# Patient Record
Sex: Male | Born: 1935 | Hispanic: No | Marital: Married | Smoking: Never smoker
Health system: Southern US, Community
[De-identification: ages and names within clinical notes are randomized; demographics above are authoritative.]

## PROBLEM LIST (undated history)

## (undated) DIAGNOSIS — I10 Essential (primary) hypertension: Secondary | ICD-10-CM

## (undated) DIAGNOSIS — I2510 Atherosclerotic heart disease of native coronary artery without angina pectoris: Secondary | ICD-10-CM

## (undated) DIAGNOSIS — E785 Hyperlipidemia, unspecified: Secondary | ICD-10-CM

## (undated) DIAGNOSIS — D649 Anemia, unspecified: Secondary | ICD-10-CM

## (undated) HISTORY — DX: Anemia, unspecified: D64.9

## (undated) HISTORY — DX: Atherosclerotic heart disease of native coronary artery without angina pectoris: I25.10

## (undated) HISTORY — PX: CORONARY ARTERY BYPASS GRAFT: SHX141

## (undated) HISTORY — DX: Essential (primary) hypertension: I10

## (undated) HISTORY — DX: Hyperlipidemia, unspecified: E78.5

## (undated) HISTORY — PX: CARDIAC CATHETERIZATION: SHX172

---

## 2003-06-20 ENCOUNTER — Encounter: Payer: Self-pay | Admitting: Cardiovascular Disease

## 2003-06-20 HISTORY — PX: US ECHOCARDIOGRAPHY: HXRAD669

## 2004-07-20 ENCOUNTER — Ambulatory Visit (HOSPITAL_COMMUNITY): Admission: RE | Admit: 2004-07-20 | Discharge: 2004-07-20 | Payer: Self-pay | Admitting: *Deleted

## 2004-07-20 ENCOUNTER — Encounter (INDEPENDENT_AMBULATORY_CARE_PROVIDER_SITE_OTHER): Payer: Self-pay | Admitting: *Deleted

## 2009-12-04 ENCOUNTER — Ambulatory Visit: Payer: Self-pay | Admitting: Cardiovascular Disease

## 2010-06-08 ENCOUNTER — Encounter: Payer: Self-pay | Admitting: Cardiovascular Disease

## 2010-06-08 ENCOUNTER — Ambulatory Visit (INDEPENDENT_AMBULATORY_CARE_PROVIDER_SITE_OTHER): Payer: Medicare Other | Admitting: Cardiovascular Disease

## 2010-06-08 DIAGNOSIS — Z951 Presence of aortocoronary bypass graft: Secondary | ICD-10-CM

## 2010-06-08 DIAGNOSIS — I200 Unstable angina: Secondary | ICD-10-CM

## 2010-06-09 ENCOUNTER — Encounter: Payer: Self-pay | Admitting: Cardiovascular Disease

## 2010-06-24 ENCOUNTER — Other Ambulatory Visit (HOSPITAL_COMMUNITY): Payer: Self-pay | Admitting: Endocrinology

## 2010-06-24 DIAGNOSIS — E0590 Thyrotoxicosis, unspecified without thyrotoxic crisis or storm: Secondary | ICD-10-CM

## 2010-06-28 ENCOUNTER — Telehealth (HOSPITAL_COMMUNITY): Payer: Self-pay | Admitting: Radiology

## 2010-06-29 ENCOUNTER — Encounter (HOSPITAL_COMMUNITY): Payer: Medicare Other

## 2010-07-01 ENCOUNTER — Other Ambulatory Visit: Payer: Self-pay | Admitting: *Deleted

## 2010-07-01 DIAGNOSIS — I2510 Atherosclerotic heart disease of native coronary artery without angina pectoris: Secondary | ICD-10-CM

## 2010-07-01 MED ORDER — ATENOLOL 50 MG PO TABS: 50.0000 mg | ORAL_TABLET | Freq: Every day | ORAL | Status: DC

## 2010-07-01 NOTE — Telephone Encounter (Signed)
Refill per incoming fax from pharm.

## 2010-07-08 NOTE — Progress Notes (Signed)
Summary: Resched Myoview to 01/06/11  Phone Note Outgoing Call   Call placed by: Leonia Corona, RT-N,  June 28, 2010 12:00 PM Call placed to: Dr. Harvie Bridge Nurse Summary of Call: Spoke with Amy at Dr. Harvie Bridge office to inform her that Larry Schroeder resched his Myoview from 06/29/10 to 01/06/11. She stated she would make Dr. Elease Hashimoto aware. Initial call taken by: Leonia Corona, RT-N,  June 28, 2010 12:01 PM

## 2010-07-13 ENCOUNTER — Ambulatory Visit (HOSPITAL_COMMUNITY): Payer: Medicare Other

## 2010-07-13 ENCOUNTER — Encounter (HOSPITAL_COMMUNITY)
Admission: RE | Admit: 2010-07-13 | Discharge: 2010-07-13 | Disposition: A | Discharge status: Home / Self Care | Payer: Medicare Other | Source: Ambulatory Visit | Attending: Endocrinology | Admitting: Endocrinology

## 2010-07-13 DIAGNOSIS — E0590 Thyrotoxicosis, unspecified without thyrotoxic crisis or storm: Secondary | ICD-10-CM | POA: Insufficient documentation

## 2010-07-13 DIAGNOSIS — R946 Abnormal results of thyroid function studies: Secondary | ICD-10-CM | POA: Insufficient documentation

## 2010-07-14 ENCOUNTER — Encounter (HOSPITAL_COMMUNITY)
Admission: RE | Admit: 2010-07-14 | Discharge: 2010-07-14 | Disposition: A | Discharge status: Home / Self Care | Payer: Medicare Other | Source: Ambulatory Visit | Attending: Endocrinology | Admitting: Endocrinology

## 2010-07-14 ENCOUNTER — Other Ambulatory Visit (HOSPITAL_COMMUNITY): Payer: Medicare Other

## 2010-07-14 MED ORDER — SODIUM IODIDE I 131 CAPSULE
12.0000 | Freq: Once | INTRAVENOUS | Status: AC | PRN
  Administered 2010-07-13: 12 via ORAL

## 2010-07-14 MED ORDER — SODIUM PERTECHNETATE TC 99M INJECTION
10.0000 | Freq: Once | INTRAVENOUS | Status: AC | PRN
  Administered 2010-07-14: 10.7 via INTRAVENOUS

## 2010-07-15 ENCOUNTER — Other Ambulatory Visit (HOSPITAL_COMMUNITY): Payer: Self-pay | Admitting: Endocrinology

## 2010-07-15 DIAGNOSIS — E0590 Thyrotoxicosis, unspecified without thyrotoxic crisis or storm: Secondary | ICD-10-CM

## 2010-07-20 ENCOUNTER — Ambulatory Visit (HOSPITAL_COMMUNITY): Payer: Medicare Other

## 2010-07-21 ENCOUNTER — Encounter (HOSPITAL_COMMUNITY): Payer: Medicare Other

## 2010-08-05 ENCOUNTER — Ambulatory Visit (HOSPITAL_COMMUNITY): Payer: Medicare Other

## 2010-08-06 ENCOUNTER — Inpatient Hospital Stay (HOSPITAL_COMMUNITY): Admission: RE | Admit: 2010-08-06 | Payer: Medicare Other | Source: Ambulatory Visit

## 2010-08-12 ENCOUNTER — Encounter (HOSPITAL_COMMUNITY)
Admission: RE | Admit: 2010-08-12 | Discharge: 2010-08-12 | Disposition: A | Discharge status: Home / Self Care | Payer: Medicare Other | Source: Ambulatory Visit | Attending: Endocrinology | Admitting: Endocrinology

## 2010-08-12 DIAGNOSIS — E0590 Thyrotoxicosis, unspecified without thyrotoxic crisis or storm: Secondary | ICD-10-CM | POA: Insufficient documentation

## 2010-08-13 ENCOUNTER — Ambulatory Visit (HOSPITAL_COMMUNITY): Payer: Medicare Other

## 2010-08-17 ENCOUNTER — Other Ambulatory Visit (HOSPITAL_COMMUNITY): Payer: Self-pay | Admitting: Endocrinology

## 2010-08-17 DIAGNOSIS — E0590 Thyrotoxicosis, unspecified without thyrotoxic crisis or storm: Secondary | ICD-10-CM

## 2010-08-25 ENCOUNTER — Ambulatory Visit (HOSPITAL_COMMUNITY): Payer: Medicare Other

## 2010-08-25 ENCOUNTER — Encounter (HOSPITAL_COMMUNITY)
Admission: RE | Admit: 2010-08-25 | Discharge: 2010-08-25 | Disposition: A | Discharge status: Home / Self Care | Payer: Medicare Other | Source: Ambulatory Visit | Attending: Endocrinology | Admitting: Endocrinology

## 2010-08-25 DIAGNOSIS — E0590 Thyrotoxicosis, unspecified without thyrotoxic crisis or storm: Secondary | ICD-10-CM | POA: Insufficient documentation

## 2010-08-26 ENCOUNTER — Encounter (HOSPITAL_COMMUNITY): Payer: Medicare Other

## 2010-08-26 ENCOUNTER — Ambulatory Visit (HOSPITAL_COMMUNITY)
Admission: RE | Admit: 2010-08-26 | Discharge: 2010-08-26 | Disposition: A | Discharge status: Home / Self Care | Payer: Medicare Other | Source: Ambulatory Visit | Attending: Endocrinology | Admitting: Endocrinology

## 2010-08-26 DIAGNOSIS — E0590 Thyrotoxicosis, unspecified without thyrotoxic crisis or storm: Secondary | ICD-10-CM | POA: Insufficient documentation

## 2010-08-26 MED ORDER — SODIUM IODIDE I 131 CAPSULE
7.7000 | Freq: Once | INTRAVENOUS | Status: AC | PRN
  Administered 2010-08-25: 7.7 via ORAL

## 2010-08-27 NOTE — Op Note (Signed)
NAMEVIBHAV, WADDILL NO.:  [id number]   MEDICAL RECORD NO.:  [id number]          PATIENT TYPE:  AMB   LOCATION:  ENDO                         FACILITY:  [facility]   PHYSICIAN:  Georgiana Spinner, M.D.    DATE OF BIRTH:  1933/09/10   DATE OF PROCEDURE:  07/20/2004  DATE OF DISCHARGE:                                 OPERATIVE REPORT   PROCEDURE:  Colonoscopy with biopsy.   INDICATIONS:  Colon polyps.   ANESTHESIA:  Demerol 40, Versed 4 milligrams.   PROCEDURE:  With the patient mildly sedated in the left lateral decubitus  position, rectal examination was performed which was unremarkable.  Subsequently the Olympus videoscopic colonoscope was inserted in the rectum  and passed under direct vision to the cecum, identified by ileocecal valve  and appendiceal orifice. From this point the colonoscope was slowly  withdrawn taking circumferential views of colonic mucosa stopping at the  ileocecal valve where a small polyp was seen and removed using hot biopsy  forceps technique setting of 20/200 blended current. We withdrew the  colonoscope all the way to the rectum which appeared normal on direct showed  hemorrhoids on retroflexed view. The endoscope was straightened and  withdrawn. The patient's vital signs, pulse oximeter remained stable. The  patient tolerated procedure well without complications.   FINDINGS:  Small polyp at ileocecal valve, internal hemorrhoids. Await  biopsy report. The patient will call me for results and follow-up with me as  an outpatient      GMO/MEDQ  D:  07/20/2004  T:  07/20/2004  Job:  [id number]

## 2010-09-28 ENCOUNTER — Telehealth: Payer: Self-pay | Admitting: Cardiovascular Disease

## 2010-09-28 MED ORDER — NITROGLYCERIN 0.4 MG SL SUBL: 0.4000 mg | SUBLINGUAL_TABLET | SUBLINGUAL | Status: DC | PRN

## 2010-09-28 MED ORDER — CLOPIDOGREL BISULFATE 75 MG PO TABS: 75.0000 mg | ORAL_TABLET | Freq: Every day | ORAL | Status: DC

## 2010-09-28 NOTE — Telephone Encounter (Signed)
Left msg that scripts would be available at desk but I can call in at any time. Alfonso Ramus RN

## 2010-09-28 NOTE — Telephone Encounter (Signed)
Called in and very patronizingly demanded to pick up his, written I believe, prescriptions for Plavix (no generic) and Nitroglycerin tomorrow between 9-10 am. I have pulled the chart.

## 2010-09-29 ENCOUNTER — Encounter: Payer: Self-pay | Admitting: Cardiovascular Disease

## 2010-09-29 ENCOUNTER — Telehealth: Payer: Self-pay | Admitting: Cardiovascular Disease

## 2010-09-29 MED ORDER — CLOPIDOGREL BISULFATE 75 MG PO TABS: 75.0000 mg | ORAL_TABLET | Freq: Every day | ORAL | Status: DC

## 2010-09-29 NOTE — Telephone Encounter (Signed)
Wants you to know his internist wanted to switch him to bystolic 10 mg instead of atenolol, he is going to try it.

## 2010-09-29 NOTE — Telephone Encounter (Signed)
Patient called and wants his plavix 75 mg refilled. He said that it is to go to [facility] and he does not want generic because he works for [employer].

## 2010-09-30 NOTE — Telephone Encounter (Signed)
I agree with changing Atenolol to Bystolic.  I like bystolic better anyway.  It's just more expensive.

## 2010-10-06 ENCOUNTER — Telehealth: Payer: Self-pay | Admitting: Cardiovascular Disease

## 2010-10-06 MED ORDER — CLOPIDOGREL BISULFATE 75 MG PO TABS: 75.0000 mg | ORAL_TABLET | Freq: Every day | ORAL | Status: AC

## 2010-10-06 MED ORDER — NITROGLYCERIN 0.4 MG SL SUBL: 0.4000 mg | SUBLINGUAL_TABLET | SUBLINGUAL | Status: DC | PRN

## 2010-10-06 NOTE — Telephone Encounter (Signed)
Patient request refill. Fax received from pharmacy. Refill completed. Alfonso Ramus RN

## 2010-10-06 NOTE — Telephone Encounter (Signed)
PT NEEDS REFILL FOR PLAVIX SENT TO [facility] AND A REFILL FOR NITRO CALL INTO [facility] [id number].

## 2010-11-23 ENCOUNTER — Encounter: Payer: Self-pay | Admitting: *Deleted

## 2010-11-25 ENCOUNTER — Ambulatory Visit (HOSPITAL_BASED_OUTPATIENT_CLINIC_OR_DEPARTMENT_OTHER)
Admission: RE | Admit: 2010-11-25 | Discharge: 2010-11-25 | Disposition: A | Discharge status: Home / Self Care | Payer: Medicare Other | Source: Ambulatory Visit | Attending: Orthopedic Surgery | Admitting: Orthopedic Surgery

## 2010-11-25 DIAGNOSIS — M6530 Trigger finger, unspecified finger: Secondary | ICD-10-CM | POA: Insufficient documentation

## 2010-11-25 DIAGNOSIS — M65839 Other synovitis and tenosynovitis, unspecified forearm: Secondary | ICD-10-CM | POA: Insufficient documentation

## 2010-12-02 NOTE — Op Note (Signed)
  Larry Schroeder, BARG NO.:  [id number]  MEDICAL RECORD NO.:  [id number]  LOCATION:                                 FACILITY:  PHYSICIAN:  Katy Fitch. Maycel Riffe, M.D. DATE OF BIRTH:  08-28-1933  DATE OF PROCEDURE:  11/25/2010 DATE OF DISCHARGE:                              OPERATIVE REPORT   PREOPERATIVE DIAGNOSIS:  Chronic stenosing tenosynovitis, left ring finger, at A1 pulley.  POSTOPERATIVE DIAGNOSIS:  Chronic stenosing tenosynovitis, left ring finger, at A1 pulley.  OPERATIONS:  Release of left ring finger A1 pulley.  OPERATING SURGEON:  Katy Fitch. Shevaun Lovan, MD  ASSISTANT:  Marveen Reeks Dasnoit, PA  ANESTHESIA:  2% lidocaine, metacarpal head level block, left ring finger.  SUPERVISING ANESTHESIOLOGIST:  Janetta Hora. Gelene Mink, MD  INDICATIONS:  [name] is a 77 year old gentleman referred for evaluation and management of chronic stenosing tenosynovitis of his left ring finger.  He has had 2 prior injections over the past year.  He has developed recurrent triggering.  We advised him to proceed with release of A1 pulley under local anesthesia.  He is brought to the operating room at this time anticipating release under straight local anesthesia.  After informed consent, he is brought to the operating room at this time.  DESCRIPTION OF PROCEDURE:  [name] was brought to room #1 of the [facility] and placed in supine position upon the operating table.  Following alcohol and Betadine prep, 2% lidocaine was infiltrated in the path of the intended incision and into the flexor sheath of the left ring finger.  The arm was then prepped with Betadine soap solution and sterilely draped.  A pneumatic tourniquet was applied to the proximal left brachium.  Following exsanguination of the left arm with Esmarch bandage, arterial tourniquet was inflated to 250 mmHg due to mild systolic hypertension.  Procedure commenced with a short oblique incision  directly over the A1 pulley.  Subcutaneous tissues were carefully divided releasing the palmar fascia, pretendinous fibers.  The A1 pulley was isolated with scissors dissection with placement of 4 blunt Ragnell retractors.  The pulley was released with scalpel and scissors.  The fascia was released proximally.  Tenosynovectomy was not required.  Thereafter, [name] demonstrated full active range of motion of his finger in flexion/extension.  The wounds were repaired with trauma sutures of 5-0 nylon.  The hand was dressed with Xeroflo sterile gauze and an Ace bandage.  For aftercare, [name] is provided prescription for Vicodin 5 mg one p.o. q.4-6 hours p.r.n. pain, #16 tablets without refill.  We anticipate he will use Aleve or Advil as his primary analgesic.     Katy Fitch Amari Burnsworth, M.D.     RVS/MEDQ  D:  11/25/2010  T:  11/25/2010  Job:  [id number]  cc:   Katy Fitch. Mavryk Pino, M.D.  Electronically Signed by Josephine Igo M.D. on 12/02/2010 08:15:21 AM

## 2010-12-16 ENCOUNTER — Telehealth: Payer: Self-pay | Admitting: Cardiovascular Disease

## 2010-12-16 NOTE — Telephone Encounter (Signed)
App changed msg left 01/31/11 9:30

## 2010-12-16 NOTE — Telephone Encounter (Signed)
He will be in [place] the week of his STRESS test and would like to change the date to around the time of his new office visit appointmnet in October. Please either call back or send a notice in the mail once the date has been set.

## 2010-12-21 ENCOUNTER — Telehealth: Payer: Self-pay | Admitting: Cardiovascular Disease

## 2010-12-21 NOTE — Telephone Encounter (Signed)
Pt calling wanting to see if he needs blood work prior to his appt in November. Please call back to advise/discuss.

## 2010-12-22 ENCOUNTER — Other Ambulatory Visit: Payer: Self-pay | Admitting: *Deleted

## 2010-12-22 DIAGNOSIS — I10 Essential (primary) hypertension: Secondary | ICD-10-CM

## 2010-12-22 NOTE — Telephone Encounter (Signed)
Pt called returning call to Jodette. Pt appt w/ Dr. Elease Hashimoto was moved to the following Monday. Pt said he can come in early on 11/7 prior to pt REST STRESS for blood work. I cannot schedule blood work w/o a order. Could you please schedule blood work for pt prior to appt on 11/7.

## 2010-12-22 NOTE — Telephone Encounter (Signed)
msg called to call back, pt having stress test 9:30, dr app 10:30, OV needs to be rescheduled for later time or diff day, needs labs while fasting also.

## 2010-12-22 NOTE — Telephone Encounter (Signed)
Marcelino Duster, i placed orders and was going to schedule but lab slots were taken prior to his stress test, unsure of protocol, please try to schedule with keith, let me know if problem, thank you much!! Jodette/ Dr Elease Hashimoto

## 2011-01-06 ENCOUNTER — Encounter (HOSPITAL_COMMUNITY): Payer: Medicare Other | Admitting: Radiology

## 2011-01-07 ENCOUNTER — Ambulatory Visit: Payer: Medicare Other | Admitting: Cardiovascular Disease

## 2011-01-31 ENCOUNTER — Encounter (HOSPITAL_COMMUNITY): Payer: Medicare Other | Admitting: Radiology

## 2011-02-04 ENCOUNTER — Ambulatory Visit: Payer: Medicare Other | Admitting: Cardiovascular Disease

## 2011-02-15 ENCOUNTER — Other Ambulatory Visit (HOSPITAL_COMMUNITY): Payer: Self-pay | Admitting: Cardiovascular Disease

## 2011-02-15 DIAGNOSIS — I200 Unstable angina: Secondary | ICD-10-CM

## 2011-02-16 ENCOUNTER — Encounter (HOSPITAL_COMMUNITY): Payer: Medicare Other | Admitting: Radiology

## 2011-02-16 ENCOUNTER — Ambulatory Visit: Payer: Medicare Other | Admitting: Cardiovascular Disease

## 2011-02-16 ENCOUNTER — Other Ambulatory Visit: Payer: Medicare Other | Admitting: *Deleted

## 2011-02-21 ENCOUNTER — Ambulatory Visit: Payer: Medicare Other | Admitting: Cardiovascular Disease

## 2011-02-24 ENCOUNTER — Ambulatory Visit (HOSPITAL_COMMUNITY): Payer: Medicare Other | Attending: Cardiovascular Disease | Admitting: Radiology

## 2011-02-24 ENCOUNTER — Other Ambulatory Visit: Payer: Self-pay

## 2011-02-24 ENCOUNTER — Other Ambulatory Visit (INDEPENDENT_AMBULATORY_CARE_PROVIDER_SITE_OTHER): Payer: Medicare Other | Admitting: *Deleted

## 2011-02-24 VITALS — Ht 66.0 in | Wt 144.0 lb

## 2011-02-24 DIAGNOSIS — R0789 Other chest pain: Secondary | ICD-10-CM | POA: Insufficient documentation

## 2011-02-24 DIAGNOSIS — Z8249 Family history of ischemic heart disease and other diseases of the circulatory system: Secondary | ICD-10-CM | POA: Insufficient documentation

## 2011-02-24 DIAGNOSIS — I10 Essential (primary) hypertension: Secondary | ICD-10-CM | POA: Insufficient documentation

## 2011-02-24 DIAGNOSIS — I200 Unstable angina: Secondary | ICD-10-CM

## 2011-02-24 DIAGNOSIS — Z951 Presence of aortocoronary bypass graft: Secondary | ICD-10-CM | POA: Insufficient documentation

## 2011-02-24 DIAGNOSIS — R0602 Shortness of breath: Secondary | ICD-10-CM

## 2011-02-24 DIAGNOSIS — R079 Chest pain, unspecified: Secondary | ICD-10-CM

## 2011-02-24 DIAGNOSIS — I25810 Atherosclerosis of coronary artery bypass graft(s) without angina pectoris: Secondary | ICD-10-CM

## 2011-02-24 DIAGNOSIS — R0989 Other specified symptoms and signs involving the circulatory and respiratory systems: Secondary | ICD-10-CM | POA: Insufficient documentation

## 2011-02-24 DIAGNOSIS — R5381 Other malaise: Secondary | ICD-10-CM | POA: Insufficient documentation

## 2011-02-24 DIAGNOSIS — E785 Hyperlipidemia, unspecified: Secondary | ICD-10-CM | POA: Insufficient documentation

## 2011-02-24 DIAGNOSIS — R0609 Other forms of dyspnea: Secondary | ICD-10-CM | POA: Insufficient documentation

## 2011-02-24 LAB — BASIC METABOLIC PANEL
BUN: 34 mg/dL — ABNORMAL HIGH (ref 6–23)
Calcium: 9.6 mg/dL (ref 8.4–10.5)
Creatinine, Ser: 1.6 mg/dL — ABNORMAL HIGH (ref 0.4–1.5)
GFR: 45.01 mL/min — ABNORMAL LOW (ref >60.00)
Glucose, Bld: 96 mg/dL (ref 70–99)
Potassium: 5.5 mEq/L — ABNORMAL HIGH (ref 3.5–5.1)

## 2011-02-24 LAB — HEPATIC FUNCTION PANEL
AST: 31 U/L (ref 0–37)
Albumin: 3.9 g/dL (ref 3.5–5.2)

## 2011-02-24 LAB — LIPID PANEL
Cholesterol: 106 mg/dL (ref 0–200)
VLDL: 20.2 mg/dL (ref 0.0–40.0)

## 2011-02-24 MED ORDER — NITROGLYCERIN 0.4 MG SL SUBL: 0.4000 mg | SUBLINGUAL_TABLET | SUBLINGUAL | Status: DC | PRN

## 2011-02-24 MED ORDER — REGADENOSON 0.4 MG/5ML IV SOLN
0.4000 mg | Freq: Once | INTRAVENOUS | Status: AC
  Administered 2011-02-24: 0.4 mg via INTRAVENOUS

## 2011-02-24 MED ORDER — TECHNETIUM TC 99M TETROFOSMIN IV KIT
33.0000 | PACK | Freq: Once | INTRAVENOUS | Status: AC | PRN
  Administered 2011-02-24: 33 via INTRAVENOUS

## 2011-02-24 MED ORDER — TECHNETIUM TC 99M TETROFOSMIN IV KIT
11.0000 | PACK | Freq: Once | INTRAVENOUS | Status: AC | PRN
  Administered 2011-02-24: 11 via INTRAVENOUS

## 2011-02-24 NOTE — Progress Notes (Addendum)
[facility] SITE 3 NUCLEAR MED [address] [place] [address] [phone]  Cardiology Nuclear Med Study  Larry Schroeder is a 77 y.o. male [id number] 24-Jul-1933   Nuclear Med Background Indication for Stress Test:  Evaluation for Ischemia and Graft Patency History:  '85 CABG; '96 Cath:Patent grafts, except occluded SVT>RCA; '05 Echo:Normal LVF; '08 EXB:MWUXL inferior ischemia Cardiac Risk Factors: Family History - CAD, Hypertension and Lipids  Symptoms:  Chest Pain with and without Exertion (last episode of chest discomfort was about 29-months ago), DOE and Fatigue   Nuclear Pre-Procedure Caffeine/Decaff Intake:  None NPO After: 7:30pm   Lungs:  Clear. IV 0.9% NS with Angio Cath:  20g  IV Site: R Antecubital  IV Started by:  Stanton Kidney, EMT-P  Chest Size (in):  40 Cup Size: n/a  Height: 5\' 6"  (1.676 m)  Weight:  144 lb (65.318 kg)  BMI:  Body mass index is 23.24 kg/(m^2). Tech Comments:  Atenolol held > 24 hours, per patient    Nuclear Med Study 1 or 2 day study: 1 day  Stress Test Type:  Treadmill/Lexiscan  Reading MD: Charlton Haws, MD  Order Authorizing Provider:  Kristeen Miss, MD  Resting Radionuclide: Technetium 85m Tetrofosmin  Resting Radionuclide Dose: 11 mCi   Stress Radionuclide:  Technetium 59m Tetrofosmin  Stress Radionuclide Dose: 33 mCi           Stress Protocol Rest HR: 54 Stress HR: 90  Rest BP: Sitting:141/75  Standing:147/68 Stress BP: 165/74  Exercise Time (min): 10:19 METS: 10.0   Predicted Max HR: 146 bpm % Max HR: 61.64 bpm Rate Pressure Product: 24401   Dose of Adenosine (mg):  n/a Dose of Lexiscan: 0.4 mg  Dose of Atropine (mg): n/a Dose of Dobutamine: n/a mcg/kg/min (at max HR)  Stress Test Technologist: Smiley Houseman, CMA-N  Nuclear Technologist:  Domenic Polite, CNMT     Rest Procedure:  Myocardial perfusion imaging was performed at rest 45 minutes following the intravenous administration of Technetium 77m  Tetrofosmin.  Rest ECG: Sinus bradycardia with approximate 2-second pauses.  Stress Procedure:  The patient attempted to walk the treadmill for 8:16 utilizing the Bruce protocol, but was unable to obtain his target heart rate.  He then received IV Lexiscan 0.4 mg over 15-seconds with the concurrent low level exercise and then Technetium 55m Tetrofosmin was injected at 30-seconds while the patient continued walking one more minute.  There were no marked ST-T wave changes prior to Lexiscan and occasional PAC's, but the patient denied any chest pain.  Quantitative spect images were obtained after a 45-minute delay.  Stress ECG: No significant change from baseline ECG  QPS Raw Data Images:  Normal; no motion artifact; normal heart/lung ratio. Stress Images:  Normal homogeneous uptake in all areas of the myocardium. Rest Images:  Normal homogeneous uptake in all areas of the myocardium. Subtraction (SDS):  Normal Transient Ischemic Dilatation (Normal <1.22):  1.15 Lung/Heart Ratio (Normal <0.45):  .36  Quantitative Gated Spect Images QGS EDV:  86 ml QGS ESV:  34 ml QGS cine images:  NL LV Function; NL Wall Motion QGS EF: 61%  Impression Exercise Capacity:  Lexiscan with low level exercise. BP Response:  Fairly flat BP response after initial high reading Clinical Symptoms:  No chest pain. ECG Impression:  No significant ST segment change suggestive of ischemia. Comparison with Prior Nuclear Study: No images to compare  Overall Impression:  Abnormal myovue with inferior basal wall ischemia.  SDS 7  and 1mm of ST segment depression in inferior leads during stress   Lexiscan with exercise   Charlton Haws

## 2011-02-25 ENCOUNTER — Other Ambulatory Visit (HOSPITAL_COMMUNITY): Payer: Self-pay | Admitting: Radiology

## 2011-02-25 DIAGNOSIS — I200 Unstable angina: Secondary | ICD-10-CM

## 2011-03-01 ENCOUNTER — Telehealth: Payer: Self-pay | Admitting: *Deleted

## 2011-03-01 DIAGNOSIS — E876 Hypokalemia: Secondary | ICD-10-CM

## 2011-03-01 NOTE — Telephone Encounter (Signed)
Spoke with pt and gave normal result. Labs ordered to re check k+

## 2011-03-01 NOTE — Telephone Encounter (Signed)
Message copied by Antony Odea on Tue Mar 01, 2011  1:35 PM ------      Message from: [place], [place] J      Created: Tue Mar 01, 2011  1:08 PM       No changes from previous

## 2011-03-02 ENCOUNTER — Ambulatory Visit: Payer: Medicare Other | Admitting: Cardiovascular Disease

## 2011-03-22 ENCOUNTER — Other Ambulatory Visit: Payer: Medicare Other | Admitting: *Deleted

## 2011-03-23 ENCOUNTER — Encounter: Payer: Self-pay | Admitting: Cardiovascular Disease

## 2011-03-23 ENCOUNTER — Ambulatory Visit (INDEPENDENT_AMBULATORY_CARE_PROVIDER_SITE_OTHER): Payer: Medicare Other | Admitting: Cardiovascular Disease

## 2011-03-23 ENCOUNTER — Other Ambulatory Visit (INDEPENDENT_AMBULATORY_CARE_PROVIDER_SITE_OTHER): Payer: Medicare Other | Admitting: *Deleted

## 2011-03-23 DIAGNOSIS — E876 Hypokalemia: Secondary | ICD-10-CM

## 2011-03-23 DIAGNOSIS — I2510 Atherosclerotic heart disease of native coronary artery without angina pectoris: Secondary | ICD-10-CM | POA: Insufficient documentation

## 2011-03-23 DIAGNOSIS — E785 Hyperlipidemia, unspecified: Secondary | ICD-10-CM | POA: Insufficient documentation

## 2011-03-23 DIAGNOSIS — N189 Chronic kidney disease, unspecified: Secondary | ICD-10-CM

## 2011-03-23 DIAGNOSIS — I10 Essential (primary) hypertension: Secondary | ICD-10-CM

## 2011-03-23 LAB — BASIC METABOLIC PANEL
BUN: 39 mg/dL — ABNORMAL HIGH (ref 6–23)
GFR: 38.06 mL/min — ABNORMAL LOW (ref >60.00)
Glucose, Bld: 87 mg/dL (ref 70–99)
Potassium: 4.7 mEq/L (ref 3.5–5.1)

## 2011-03-23 NOTE — Assessment & Plan Note (Signed)
His lipid levels are very acceptable. We'll continue with his current medications.

## 2011-03-23 NOTE — Patient Instructions (Signed)
Your physician wants you to follow-up in:  6 months. You will receive a reminder letter in the mail two months in advance. If you don't receive a letter, please call our office to schedule the follow-up appointment.   

## 2011-03-23 NOTE — Assessment & Plan Note (Signed)
He has a baseline creatinine of between 1.7 - 1.8. His last potassium level was slightly elevated at 5.5. He's been cutting down on his high potassium foods. We've rechecked a basic metabolic profile today.

## 2011-03-23 NOTE — Assessment & Plan Note (Signed)
CP has a history of coronary artery disease with coronary artery bypass grafting in 1985. Cardiac catheterization in 1996 reveals that the saphenous vein graft to the right coronary artery is occluded. He has one single SVG that feeds the obtuse marginal artery, first diagonal artery, and left anterior descending artery. The graft had only minor luminal regularities in 1996.  He recently had a stress Myoview study which reveals the presence of a small inferior basilar defect. Extrapolating his cardiac cath data from 1996, it is not unusual that he has an inferior basilar defect since the native right coronary artery is occluded and since the saphenous vein graft to the right coronary artery is occluded. I'm sure that this region is fed via collateral flow.  CP does not have any episodes of chest pain. He's able to exercise on a regular basis. I do not think that he needs any further testing at this time. I have asked him to let me know as soon as possible if he starts to develop any exertional chest discomfort.

## 2011-03-23 NOTE — Progress Notes (Signed)
    Larry Schroeder Date of Birth  1934/04/02 [facility] [address]    [address] [place], [place]  [address] [phone]  Fax  [phone]  History of Present Illness:  CP is a 77 yo with a hx of CAD - s/p CABG.    Current Outpatient Prescriptions on File Prior to Visit  Medication Sig Dispense Refill  . atenolol (TENORMIN) 50 MG tablet Take 1 tablet (50 mg total) by mouth daily.  30 tablet  11  . clopidogrel (PLAVIX) 75 MG tablet Take 1 tablet (75 mg total) by mouth daily.  90 tablet  3  . nitroGLYCERIN (NITROSTAT) 0.4 MG SL tablet Place 1 tablet (0.4 mg total) under the tongue every 5 (five) minutes as needed for chest pain.  25 tablet  3    No Known Allergies  Past Medical History  Diagnosis Date  . Hypertension   . Anemia   . Hyperlipidemia   . Coronary artery disease     Status post CABG 1985    Past Surgical History  Procedure Date  . Coronary artery bypass graft   . US echocardiography 06-20-2003    EF 60-65%  . Cardiac catheterization     History  Smoking status  . Never Smoker   Smokeless tobacco  . Not on file    History  Alcohol Use: Not on file    No family history on file.  Reviw of Systems:  Reviewed in the HPI.  All other systems are negative.  Physical Exam: BP 135/67  Pulse 56  Ht 5\' 6"  (1.676 m)  Wt 145 lb 12.8 oz (66.134 kg)  BMI 23.53 kg/m2 The patient is alert and oriented x 3.  The mood and affect are normal.   Skin: warm and dry.  Color is normal.    HEENT:   Normocephalic/atraumatic. His neck is supple. There is no JVD. His carotids are normal.  Lungs: His lungs are clear to auscultation.   Heart: Regular rate S1-S2. No murmurs.    Abdomen: Abdominal exam is benign. He is no hepatosplenomegaly.  Extremities:  No clubbing cyanosis or edema  Neuro:  Her exam is nonfocal. Gait is normal.    ECG: Sinus bradycardia with a first-degree AV block Assessment / Plan:

## 2011-03-24 ENCOUNTER — Telehealth: Payer: Self-pay | Admitting: Cardiovascular Disease

## 2011-03-24 NOTE — Telephone Encounter (Signed)
New msg Pt called about potassium level

## 2011-03-24 NOTE — Telephone Encounter (Signed)
Pt was called with results

## 2011-04-13 ENCOUNTER — Encounter: Payer: Self-pay | Admitting: Cardiovascular Disease

## 2011-05-03 ENCOUNTER — Other Ambulatory Visit: Payer: Self-pay | Admitting: *Deleted

## 2011-05-03 DIAGNOSIS — I2510 Atherosclerotic heart disease of native coronary artery without angina pectoris: Secondary | ICD-10-CM

## 2011-05-03 MED ORDER — ATENOLOL 50 MG PO TABS: 50.0000 mg | ORAL_TABLET | Freq: Every day | ORAL | Status: DC

## 2011-05-03 NOTE — Telephone Encounter (Signed)
Fax Received. Refill Completed. Larry Schroeder (R.M.A)   

## 2011-10-19 ENCOUNTER — Encounter: Payer: Self-pay | Admitting: Cardiovascular Disease

## 2011-10-19 ENCOUNTER — Ambulatory Visit (INDEPENDENT_AMBULATORY_CARE_PROVIDER_SITE_OTHER): Payer: Medicare Other | Admitting: Cardiovascular Disease

## 2011-10-19 VITALS — BP 123/68 | HR 56 | Ht 66.0 in | Wt 143.8 lb

## 2011-10-19 DIAGNOSIS — I2510 Atherosclerotic heart disease of native coronary artery without angina pectoris: Secondary | ICD-10-CM

## 2011-10-19 DIAGNOSIS — I10 Essential (primary) hypertension: Secondary | ICD-10-CM | POA: Insufficient documentation

## 2011-10-19 NOTE — Patient Instructions (Addendum)
Your physician wants you to follow-up in: 6 MONTHS You will receive a reminder letter in the mail two months in advance. If you don't receive a letter, please call our office to schedule the follow-up appointment.  Your physician recommends that you return for a FASTING lipid profile: 6 MONTHS    

## 2011-10-19 NOTE — Assessment & Plan Note (Signed)
CP has done very well. He's not had any episodes of chest pain or shortness of breath. Quite pleased with his progress. He works out every day.  We'll continue to keep a close eye on his cholesterol. He had labs recently. He has persistently decreased HDL. We'll check fasting lipids again on him in 6 months.

## 2011-10-19 NOTE — Progress Notes (Signed)
Larry Schroeder Date of Birth  08-30-1933       [facility]    [facility] [address]  [address] [place], [place]  [address]   [place], [place]  [address] [phone]     [phone]   Fax  [phone]    Fax [phone]  Problem List: 1. CAD- s/p CABG 1985 - his last cath was in 1996. The saphenous vein graft to the obtuse marginal artery with a 1, first diagonal, and LAD as was widely patent. The saphenous vein graft to the right coronary artery was occluded. His native coronary arteries are occluded. A stress Myoview study in 2008 revealed inferior basilar scar. Stress Myoview study in November, 2012 revealed a similar pattern of inferior basilar ischemia with normal perfusion and the other walls. 2. chronic renal insufficiency 3. Dyslipidemia-low HDL 4. Anemia   History of Present Illness:  CP is doing well. His medical Dr. has changed some of his blood pressure medications. He was taking Avapro and Avalide. It caused his creatinine to go up slightly. He also noted that he was having an itchy rash on his arms. His medical Dr. changed him to Losartan and the rash went away and his creatinine has improved slightly.  He continues to exercise on a regular basis. He walks 3-1/2 miles a day and one hour. He exercises 5 times a week. He's not had any cardiac problems.  Some recent lab work indicated that his alkaline phosphatase was minimally elevated.    Current Outpatient Prescriptions on File Prior to Visit  Medication Sig Dispense Refill  . aspirin 81 MG tablet Take 81 mg by mouth daily.        Marland Kitchen atenolol (TENORMIN) 50 MG tablet Take 1 tablet (50 mg total) by mouth daily.  30 tablet  5  . Cholecalciferol (VITAMIN D-3 PO) Take 1,000 mg by mouth daily.        . irbesartan (AVAPRO) 150 MG tablet Take 150 mg by mouth at bedtime.        Marland Kitchen losartan (COZAAR) 50 MG tablet Take 50 mg by mouth daily.      . Multiple  Vitamins-Minerals (MULTIVITAMIN PO) Take by mouth.        . nitroGLYCERIN (NITROSTAT) 0.4 MG SL tablet Place 1 tablet (0.4 mg total) under the tongue every 5 (five) minutes as needed for chest pain.  25 tablet  3  . Omega-3 Fatty Acids (FISH OIL) 1200 MG CAPS Take by mouth.        . rosuvastatin (CRESTOR) 10 MG tablet Take 10 mg by mouth daily.        . silodosin (RAPAFLO) 8 MG CAPS capsule Take 8 mg by mouth daily with breakfast.          No Known Allergies  Past Medical History  Diagnosis Date  . Hypertension   . Anemia   . Hyperlipidemia   . Coronary artery disease     Status post CABG 1985    Past Surgical History  Procedure Date  . Coronary artery bypass graft   . US echocardiography 06-20-2003    EF 60-65%  . Cardiac catheterization     History  Smoking status  . Never Smoker   Smokeless tobacco  . Not on file    History  Alcohol Use: Not on file    No family history on file.  Reviw of Systems:  Reviewed in the HPI.  All other  systems are negative.  Physical Exam: Blood pressure 123/68, pulse 56, height 5\' 6"  (1.676 m), weight 143 lb 12.8 oz (65.227 kg). General: Well developed, well nourished, in no acute distress.  Head: Normocephalic, atraumatic, sclera non-icteric, mucus membranes are moist,   Neck: Supple. Carotids are 2 + without bruits. No JVD  Lungs: Clear bilaterally to auscultation.  Heart: regular rate.  normal  S1 S2. No murmurs, gallops or rubs.  Abdomen: Soft, non-tender, non-distended with normal bowel sounds. No hepatomegaly. No rebound/guarding. No masses.  Msk:  Strength and tone are normal  Extremities: No clubbing or cyanosis. No edema.  Distal pedal pulses are 2+ and equal bilaterally.  Neuro: Alert and oriented X 3. Moves all extremities spontaneously.  Psych:  Responds to questions appropriately with a normal affect.  ECG:  Assessment / Plan:

## 2011-10-19 NOTE — Assessment & Plan Note (Signed)
He's been on Avapro and Avalide for years for his hypertension. He was recently noted to have some mildly increased creatinine levels. The Avalide was discontinued and he was started on losartan. I think this is an acceptable choice. His blood pressure remains normal and his kidney function remains stable.

## 2011-10-20 ENCOUNTER — Other Ambulatory Visit: Payer: Self-pay | Admitting: Gastroenterology

## 2011-10-24 ENCOUNTER — Encounter: Payer: Self-pay | Admitting: Cardiovascular Disease

## 2011-10-24 ENCOUNTER — Other Ambulatory Visit: Payer: Self-pay | Admitting: Gastroenterology

## 2011-10-24 DIAGNOSIS — R109 Unspecified abdominal pain: Secondary | ICD-10-CM

## 2011-10-26 ENCOUNTER — Encounter: Payer: Self-pay | Admitting: Cardiovascular Disease

## 2011-11-04 ENCOUNTER — Other Ambulatory Visit: Payer: Medicare Other

## 2011-11-09 ENCOUNTER — Ambulatory Visit
Admission: RE | Admit: 2011-11-09 | Discharge: 2011-11-09 | Disposition: A | Discharge status: Home / Self Care | Payer: Medicare Other | Source: Ambulatory Visit | Attending: Gastroenterology | Admitting: Gastroenterology

## 2011-11-09 DIAGNOSIS — R109 Unspecified abdominal pain: Secondary | ICD-10-CM

## 2012-01-30 ENCOUNTER — Telehealth: Payer: Self-pay | Admitting: Cardiovascular Disease

## 2012-01-30 MED ORDER — CLOPIDOGREL BISULFATE 75 MG PO TABS: 75.0000 mg | ORAL_TABLET | Freq: Every day | ORAL | Status: DC

## 2012-01-30 NOTE — Telephone Encounter (Signed)
plz return call to patient [phone] regarding pavix  Medication, not listed in med list.    Patient would like samples as he is completely out of  Medication. Rx  should be called into [facility] [place], [place]

## 2012-01-30 NOTE — Telephone Encounter (Signed)
msg left for pt, no samples available, script sent in, asked him to call back with any further questions or concerns,

## 2012-02-04 ENCOUNTER — Emergency Department (HOSPITAL_COMMUNITY): Payer: Medicare Other

## 2012-02-04 ENCOUNTER — Encounter (HOSPITAL_COMMUNITY): Payer: Self-pay | Admitting: Emergency Medicine

## 2012-02-04 ENCOUNTER — Emergency Department (HOSPITAL_COMMUNITY)
Admission: EM | Admit: 2012-02-04 | Discharge: 2012-02-04 | Disposition: A | Discharge status: Home / Self Care | Payer: Medicare Other | Attending: Emergency Medicine | Admitting: Emergency Medicine

## 2012-02-04 DIAGNOSIS — I2510 Atherosclerotic heart disease of native coronary artery without angina pectoris: Secondary | ICD-10-CM | POA: Insufficient documentation

## 2012-02-04 DIAGNOSIS — N390 Urinary tract infection, site not specified: Secondary | ICD-10-CM

## 2012-02-04 DIAGNOSIS — D649 Anemia, unspecified: Secondary | ICD-10-CM | POA: Insufficient documentation

## 2012-02-04 DIAGNOSIS — Z951 Presence of aortocoronary bypass graft: Secondary | ICD-10-CM | POA: Insufficient documentation

## 2012-02-04 DIAGNOSIS — R55 Syncope and collapse: Secondary | ICD-10-CM | POA: Insufficient documentation

## 2012-02-04 DIAGNOSIS — Z7982 Long term (current) use of aspirin: Secondary | ICD-10-CM | POA: Insufficient documentation

## 2012-02-04 DIAGNOSIS — I10 Essential (primary) hypertension: Secondary | ICD-10-CM | POA: Insufficient documentation

## 2012-02-04 LAB — CBC WITH DIFFERENTIAL/PLATELET
Basophils Relative: 0 % (ref 0–1)
HCT: 36.8 % — ABNORMAL LOW (ref 39.0–52.0)
Hemoglobin: 12.5 g/dL — ABNORMAL LOW (ref 13.0–17.0)
MCHC: 34 g/dL (ref 30.0–36.0)
Monocytes Absolute: 1.2 10*3/uL — ABNORMAL HIGH (ref 0.1–1.0)
Monocytes Relative: 7 % (ref 3–12)
Neutro Abs: 13.7 10*3/uL — ABNORMAL HIGH (ref 1.7–7.7)

## 2012-02-04 LAB — BASIC METABOLIC PANEL
BUN: 26 mg/dL — ABNORMAL HIGH (ref 6–23)
CO2: 24 mEq/L (ref 19–32)
Chloride: 103 mEq/L (ref 96–112)
GFR calc Af Amer: 42 mL/min — ABNORMAL LOW (ref >90)
Glucose, Bld: 107 mg/dL — ABNORMAL HIGH (ref 70–99)
Potassium: 4.9 mEq/L (ref 3.5–5.1)

## 2012-02-04 LAB — URINALYSIS, ROUTINE W REFLEX MICROSCOPIC
Bilirubin Urine: NEGATIVE
Specific Gravity, Urine: 1.024 (ref 1.005–1.030)
Urobilinogen, UA: 0.2 mg/dL (ref 0.0–1.0)
pH: 5.5 (ref 5.0–8.0)

## 2012-02-04 MED ORDER — CEFTRIAXONE SODIUM 1 G IJ SOLR
1.0000 g | INTRAMUSCULAR | Status: DC
  Administered 2012-02-04: 1 g via INTRAVENOUS
  Filled 2012-02-04: qty 10

## 2012-02-04 MED ORDER — CEPHALEXIN 500 MG PO CAPS: 500.0000 mg | ORAL_CAPSULE | Freq: Four times a day (QID) | ORAL | Status: DC

## 2012-02-04 MED ORDER — SODIUM CHLORIDE 0.9 % IV SOLN
INTRAVENOUS | Status: DC
  Administered 2012-02-04: 17:00:00 via INTRAVENOUS

## 2012-02-04 MED ORDER — SODIUM CHLORIDE 0.9 % IV SOLN
INTRAVENOUS | Status: DC
  Administered 2012-02-04: 20:00:00 via INTRAVENOUS

## 2012-02-04 NOTE — ED Provider Notes (Signed)
History     CSN: [id number]  Arrival date & time 02/04/12  1429   First MD Initiated Contact with Patient 02/04/12 1507      Chief Complaint  Patient presents with  . Near Syncope    (Consider location/radiation/quality/duration/timing/severity/associated sxs/prior treatment) The history is provided by the patient and the spouse.   Patient given near syncopal event while standing. According to the wife, who witnessed the entire event, patient had been outside standing for approximately 2 hours when he came inside he seemed to be off balance and patient fell to the ground. She caught the patient he did not strike his head. There was some transient shaking for possibly 2-5 seconds but there was no postictal period. Patient denies any prodromal symptoms of chest pain, shortness of breath, headache, neck pain, abdominal pain. Recently treated for UTI. No fever, chills, nausea, vomiting. Patient feels back to space out this time. No prior history of same Past Medical History  Diagnosis Date  . Hypertension   . Anemia   . Hyperlipidemia   . Coronary artery disease     Status post CABG 1985    Past Surgical History  Procedure Date  . Coronary artery bypass graft   . US echocardiography 06-20-2003    EF 60-65%  . Cardiac catheterization     History reviewed. No pertinent family history.  History  Substance Use Topics  . Smoking status: Never Smoker   . Smokeless tobacco: Not on file  . Alcohol Use: Not on file      Review of Systems  All other systems reviewed and are negative.    Allergies  Review of patient's allergies indicates no known allergies.  Home Medications   Current Outpatient Rx  Name Route Sig Dispense Refill  . ASPIRIN 81 MG PO TABS Oral Take 81 mg by mouth daily.      . ATENOLOL 50 MG PO TABS Oral Take 1 tablet (50 mg total) by mouth daily. 30 tablet 5  . VITAMIN D-3 PO Oral Take 1,000 mg by mouth daily.      Marland Kitchen CLOPIDOGREL BISULFATE 75 MG PO TABS  Oral Take 1 tablet (75 mg total) by mouth daily. 90 tablet 3  . IRBESARTAN 150 MG PO TABS Oral Take 150 mg by mouth at bedtime.      Marland Kitchen LOSARTAN POTASSIUM 50 MG PO TABS Oral Take 50 mg by mouth daily.    . MULTIVITAMIN PO Oral Take by mouth.      Marland Kitchen NITROGLYCERIN 0.4 MG SL SUBL Sublingual Place 1 tablet (0.4 mg total) under the tongue every 5 (five) minutes as needed for chest pain. 25 tablet 3  . FISH OIL 1200 MG PO CAPS Oral Take by mouth.      . ROSUVASTATIN CALCIUM 10 MG PO TABS Oral Take 10 mg by mouth daily.      Marland Kitchen SILODOSIN 8 MG PO CAPS Oral Take 8 mg by mouth daily with breakfast.        BP 136/60  Pulse 73  Temp 98.9 F (37.2 C) (Oral)  Resp 25  Ht 5\' 6"  (1.676 m)  Wt 143 lb (64.864 kg)  BMI 23.08 kg/m2  SpO2 98%  Physical Exam  Nursing note and vitals reviewed. Constitutional: He is oriented to person, place, and time. He appears well-developed and well-nourished.  Non-toxic appearance. No distress.  HENT:  Head: Normocephalic and atraumatic.  Eyes: Conjunctivae normal, EOM and lids are normal. Pupils are equal, round, and reactive to  light.  Neck: Normal range of motion. Neck supple. No tracheal deviation present. No mass present.  Cardiovascular: Normal rate, regular rhythm and normal heart sounds.  Exam reveals no gallop.   No murmur heard. Pulmonary/Chest: Effort normal and breath sounds normal. No stridor. No respiratory distress. He has no decreased breath sounds. He has no wheezes. He has no rhonchi. He has no rales.  Abdominal: Soft. Normal appearance and bowel sounds are normal. He exhibits no distension. There is no tenderness. There is no rebound and no CVA tenderness.  Musculoskeletal: Normal range of motion. He exhibits no edema and no tenderness.  Neurological: He is alert and oriented to person, place, and time. He has normal strength. No cranial nerve deficit or sensory deficit. GCS eye subscore is 4. GCS verbal subscore is 5. GCS motor subscore is 6.  Skin:  Skin is warm and dry. No abrasion and no rash noted.  Psychiatric: He has a normal mood and affect. His speech is normal and behavior is normal.    ED Course  Procedures (including critical care time)   Labs Reviewed  BASIC METABOLIC PANEL  CBC WITH DIFFERENTIAL  URINALYSIS, ROUTINE W REFLEX MICROSCOPIC  URINE CULTURE   No results found.   No diagnosis found.    MDM   Date: 01/21/2012  Rate: 69  Rhythm: normal sinus rhythm  QRS Axis: normal  Intervals: normal  ST/T Wave abnormalities: normal  Conduction Disutrbances:first-degree A-V block   Narrative Interpretation:   Old EKG Reviewed: none available   8:37 PM Patient's result noted and given Rocephin. He was recently started on Bactrim and i will  discontinue that and placed him on Keflex     Toy Baker, MD 02/04/12 2038

## 2012-02-04 NOTE — ED Notes (Signed)
Patient transported to X-ray then ct scan.

## 2012-02-04 NOTE — ED Notes (Signed)
[name] X 4. NAD. Ambulatory. Vitals stable. Respirations even and regular. NAD. Denies pain. Denies SOB. Verbalized need to complete full dose of antibiotic and warning signs that infection is worsening. No further questions at this time.

## 2012-02-04 NOTE — ED Notes (Signed)
Pt presents to ed with near syncope episodes today x 2. Pt started new antibiotic yesterday (bactrim) for UTI. 4 baby asa, 20g to lt forearm given by EMS.

## 2012-02-06 LAB — URINE CULTURE
Colony Count: NO GROWTH
Culture: NO GROWTH

## 2012-02-28 ENCOUNTER — Ambulatory Visit (INDEPENDENT_AMBULATORY_CARE_PROVIDER_SITE_OTHER): Payer: Medicare Other | Admitting: Cardiovascular Disease

## 2012-02-28 ENCOUNTER — Encounter: Payer: Self-pay | Admitting: Cardiovascular Disease

## 2012-02-28 VITALS — BP 140/80 | HR 60 | Ht 66.0 in | Wt 145.8 lb

## 2012-02-28 DIAGNOSIS — I951 Orthostatic hypotension: Secondary | ICD-10-CM | POA: Insufficient documentation

## 2012-02-28 DIAGNOSIS — I2510 Atherosclerotic heart disease of native coronary artery without angina pectoris: Secondary | ICD-10-CM

## 2012-02-28 MED ORDER — ATENOLOL 50 MG PO TABS: 50.0000 mg | ORAL_TABLET | Freq: Every day | ORAL | Status: DC

## 2012-02-28 NOTE — Patient Instructions (Addendum)
Your physician wants you to follow-up in: 6 months  You will receive a reminder letter in the mail two months in advance. If you don't receive a letter, please call our office to schedule the follow-up appointment.  Your physician recommends that you return for a FASTING lipid profile: 6 months   

## 2012-02-28 NOTE — Assessment & Plan Note (Signed)
He is stable. He's not having episodes of chest pain. We'll continue to follow him and  keep a close on his cholesterol levels.

## 2012-02-28 NOTE — Progress Notes (Signed)
Larry Schroeder Date of Birth  [birth year]       [facility]    [place] [address]  [address] [place], [place]  [address]   [place], [place]  [address] [phone]     [phone]   Fax  [phone]    Fax [phone]  Problem List: 1. CAD- s/p CABG 1985 - his last cath was in 1996. The saphenous vein graft to the obtuse marginal artery with a 1, first diagonal, and LAD as was widely patent. The saphenous vein graft to the right coronary artery was occluded. His native coronary arteries are occluded. A stress Myoview study in 2008 revealed inferior basilar scar. Stress Myoview study in November, 2012 revealed a similar pattern of inferior basilar ischemia with normal perfusion and the other walls. 2. chronic renal insufficiency 3. Dyslipidemia-low HDL 4. Anemia   History of Present Illness:  CP is doing well. His medical Dr. has changed some of his blood pressure medications. He was taking Avapro and Avalide. It caused his creatinine to go up slightly. He also noted that he was having an itchy rash on his arms. His medical Dr. changed him to Losartan and the rash went away and his creatinine has improved slightly.  He continues to exercise on a regular basis. He walks 3-1/2 miles a day and one hour. He exercises 5 times a week. He's not had any cardiac problems.  Some recent lab work indicated that his alkaline phosphatase was minimally elevated.  February 28, 2012: He recently had a UTI.  He   Current Outpatient Prescriptions on File Prior to Visit  Medication Sig Dispense Refill  . aspirin EC 81 MG tablet Take 81 mg by mouth daily.      Marland Kitchen atenolol (TENORMIN) 50 MG tablet Take 1 tablet (50 mg total) by mouth daily.  30 tablet  5  . B Complex-C (SUPER B COMPLEX PO) Take 1 tablet by mouth daily.      . Cholecalciferol (VITAMIN D-3 PO) Take 1,000 mg by mouth daily.        . clopidogrel (PLAVIX) 75 MG tablet Take 1 tablet  (75 mg total) by mouth daily.  90 tablet  3  . Multiple Vitamins-Minerals (MULTIVITAMIN PO) Take by mouth.        . Omega-3 Fatty Acids (FISH OIL) 1200 MG CAPS Take by mouth.        . rosuvastatin (CRESTOR) 10 MG tablet Take 10 mg by mouth daily.        . silodosin (RAPAFLO) 8 MG CAPS capsule Take 8 mg by mouth daily with breakfast.        . [DISCONTINUED] nitroGLYCERIN (NITROSTAT) 0.4 MG SL tablet Place 1 tablet (0.4 mg total) under the tongue every 5 (five) minutes as needed for chest pain.  25 tablet  3    No Known Allergies  Past Medical History  Diagnosis Date  . Hypertension   . Anemia   . Hyperlipidemia   . Coronary artery disease     Status post CABG 1985    Past Surgical History  Procedure Date  . Coronary artery bypass graft   . US echocardiography 06-20-2003    EF 60-65%  . Cardiac catheterization     History  Smoking status  . Never Smoker   Smokeless tobacco  . Not on file    History  Alcohol Use No    No family history on file.  Reviw  of Systems:  Reviewed in the HPI.  All other systems are negative.  Physical Exam: Blood pressure 140/80, pulse 60, height 5\' 6"  (1.676 m), weight 145 lb 12.8 oz (66.134 kg), SpO2 98.00%. General: Well developed, well nourished, in no acute distress.  Head: Normocephalic, atraumatic, sclera non-icteric, mucus membranes are moist,   Neck: Supple. Carotids are 2 + without bruits. No JVD  Lungs: Clear bilaterally to auscultation.  Heart: regular rate.  normal  S1 S2. No murmurs, gallops or rubs.  Abdomen: Soft, non-tender, non-distended with normal bowel sounds. No hepatomegaly. No rebound/guarding. No masses.  Msk:  Strength and tone are normal  Extremities: No clubbing or cyanosis. No edema.  Distal pedal pulses are 2+ and equal bilaterally.  Neuro: Alert and oriented X 3. Moves all extremities spontaneously.  Psych:  Responds to questions appropriately with a normal affect.  ECG:  Assessment / Plan:

## 2012-02-28 NOTE — Assessment & Plan Note (Signed)
CP presents for further evaluation following an episode of orthostatic hypertension. He's had multiple episodes in the past. These seem to be related to taking Rapaflo. The primary side effect listed with this medication is orthostatic hypotension.  He has had repetitive episodes of urinary tract infections and I suspect that he needs to have a TURP. He will be following up with his urologist.  He walks 3-1/2 miles every day. I do not think that there is any evidence that he has any underlying cardiac abnormality that would explain these episodes of near syncope.

## 2012-07-04 ENCOUNTER — Other Ambulatory Visit: Payer: Self-pay | Admitting: *Deleted

## 2012-07-04 MED ORDER — CLOPIDOGREL BISULFATE 75 MG PO TABS: 75.0000 mg | ORAL_TABLET | Freq: Every day | ORAL | Status: DC

## 2012-07-04 NOTE — Telephone Encounter (Signed)
NO REFILL UNTIL APPOINTMENT 08-27-2012. Fax Received. Refill Completed. Denym Rahimi Chowoe (R.M.A)

## 2012-08-27 ENCOUNTER — Encounter: Payer: Self-pay | Admitting: Cardiovascular Disease

## 2012-08-27 ENCOUNTER — Ambulatory Visit (INDEPENDENT_AMBULATORY_CARE_PROVIDER_SITE_OTHER): Payer: Medicare Other | Admitting: Cardiovascular Disease

## 2012-08-27 VITALS — BP 138/84 | HR 63 | Ht 66.0 in | Wt 148.0 lb

## 2012-08-27 DIAGNOSIS — I2510 Atherosclerotic heart disease of native coronary artery without angina pectoris: Secondary | ICD-10-CM

## 2012-08-27 DIAGNOSIS — E785 Hyperlipidemia, unspecified: Secondary | ICD-10-CM

## 2012-08-27 NOTE — Patient Instructions (Addendum)
Your physician recommends that you return for a FASTING lipid profile: Wednesday AND IN 6 MONTHS  Your physician wants you to follow-up in: 6 MONTHS You will receive a reminder letter in the mail two months in advance. If you don't receive a letter, please call our office to schedule the follow-up appointment.

## 2012-08-27 NOTE — Progress Notes (Signed)
Larry Schroeder Date of Birth  31-Mar-1934       [facility]    [place] [address]  [address] [place], [place]  [address]   [place], [place]  [address] [phone]     [phone]   Fax  [phone]    Fax [phone]  Problem List: 1. CAD- s/p CABG 1985 - his last cath was in 1996. The saphenous vein graft to the obtuse marginal artery with a 1, first diagonal, and LAD as was widely patent. The saphenous vein graft to the right coronary artery was occluded. His native coronary arteries are occluded. A stress Myoview study in 2008 revealed inferior basilar scar. Stress Myoview study in November, 2012 revealed a similar pattern of inferior basilar ischemia with normal perfusion and the other walls. 2. chronic renal insufficiency 3. Dyslipidemia-low HDL 4. Anemia   History of Present Illness:  CP is doing well. His medical Dr. has changed some of his blood pressure medications. He was taking Avapro and Avalide. It caused his creatinine to go up slightly. He also noted that he was having an itchy rash on his arms. His medical Dr. changed him to Losartan and the rash went away and his creatinine has improved slightly.  He continues to exercise on a regular basis. He walks 3-1/2 miles a day and one hour. He exercises 5 times a week. He's not had any cardiac problems.  Some recent lab work indicated that his alkaline phosphatase was minimally elevated.  February 28, 2012: He recently had a UTI.   Aug 27, 2012:  CP is doing well.  Walks on the treadmill 5 days a week. 3.5 mph for 1 hour a day.   No angina.   Needs fasting labs.   Current Outpatient Prescriptions on File Prior to Visit  Medication Sig Dispense Refill  . aspirin EC 81 MG tablet Take 81 mg by mouth daily.      Marland Kitchen atenolol (TENORMIN) 50 MG tablet Take 1 tablet (50 mg total) by mouth daily.  90 tablet  3  . B Complex-C (SUPER B COMPLEX PO) Take 1 tablet by mouth  daily.      . Cholecalciferol (VITAMIN D-3 PO) Take 1,000 mg by mouth daily.        . clopidogrel (PLAVIX) 75 MG tablet Take 1 tablet (75 mg total) by mouth daily.  30 tablet  3  . Multiple Vitamins-Minerals (MULTIVITAMIN PO) Take by mouth.        . nitroGLYCERIN (NITROSTAT) 0.4 MG SL tablet Place 0.4 mg under the tongue every 5 (five) minutes as needed.      . Omega-3 Fatty Acids (FISH OIL) 1200 MG CAPS Take by mouth.        . rosuvastatin (CRESTOR) 10 MG tablet Take 10 mg by mouth daily.        . silodosin (RAPAFLO) 8 MG CAPS capsule Take 8 mg by mouth daily with breakfast.         No current facility-administered medications on file prior to visit.    No Known Allergies  Past Medical History  Diagnosis Date  . Hypertension   . Anemia   . Hyperlipidemia   . Coronary artery disease     Status post CABG 1985    Past Surgical History  Procedure Laterality Date  . Coronary artery bypass graft    . US echocardiography  06-20-2003    EF 60-65%  . Cardiac catheterization  History  Smoking status  . Never Smoker   Smokeless tobacco  . Not on file    History  Alcohol Use No    No family history on file.  Reviw of Systems:  Reviewed in the HPI.  All other systems are negative.  Physical Exam: Blood pressure 138/84, pulse 63, height 5\' 6"  (1.676 m), weight 148 lb (67.132 kg), SpO2 98.00%. General: Well developed, well nourished, in no acute distress.  Head: Normocephalic, atraumatic, sclera non-icteric, mucus membranes are moist,   Neck: Supple. Carotids are 2 + without bruits. No JVD  Lungs: Clear bilaterally to auscultation.  Heart: regular rate.  normal  S1 S2.  Soft systolic murmur.   Abdomen: Soft, non-tender, non-distended with normal bowel sounds. No hepatomegaly. No rebound/guarding. No masses.  Msk:  Strength and tone are normal  Extremities: No clubbing or cyanosis. No edema.  Distal pedal pulses are 2+ and equal bilaterally.  Neuro: Alert and  oriented X 3. Moves all extremities spontaneously.  Psych:  Responds to questions appropriately with a normal affect.  ECG:  Assessment / Plan:

## 2012-08-27 NOTE — Assessment & Plan Note (Signed)
CP is doing very well. He's not had any episodes of chest pain or shortness breath. He had his coronary artery bypass grafting performed 29 years ago. We'll continue the same medications. We'll check fasting labs on him on a regular basis.

## 2012-08-29 ENCOUNTER — Other Ambulatory Visit (INDEPENDENT_AMBULATORY_CARE_PROVIDER_SITE_OTHER): Payer: Medicare Other

## 2012-08-29 DIAGNOSIS — E785 Hyperlipidemia, unspecified: Secondary | ICD-10-CM

## 2012-08-29 LAB — HEPATIC FUNCTION PANEL
Albumin: 3.8 g/dL (ref 3.5–5.2)
Total Bilirubin: 0.7 mg/dL (ref 0.3–1.2)

## 2012-08-29 LAB — LIPID PANEL
HDL: 33.1 mg/dL — ABNORMAL LOW (ref >39.00)
Total CHOL/HDL Ratio: 3
VLDL: 20.4 mg/dL (ref 0.0–40.0)

## 2012-08-29 LAB — BASIC METABOLIC PANEL
CO2: 26 mEq/L (ref 19–32)
Chloride: 107 mEq/L (ref 96–112)
Glucose, Bld: 87 mg/dL (ref 70–99)
Potassium: 4.3 mEq/L (ref 3.5–5.1)
Sodium: 138 mEq/L (ref 135–145)

## 2012-12-04 ENCOUNTER — Telehealth: Payer: Self-pay | Admitting: Cardiovascular Disease

## 2012-12-04 NOTE — Telephone Encounter (Signed)
Left pt a message to call back. 

## 2012-12-04 NOTE — Telephone Encounter (Signed)
New Problem  Pt states he has faxed over discharge papers and request that the Dr. Coolidge Breeze over it for a quicker appt.// He wants an appt this thursday with Dr. Nahser// Advised pt that he had the next available with [facility]

## 2012-12-04 NOTE — Telephone Encounter (Signed)
Follow up  Pt requested appt with Dr Elease Hashimoto, per Dr Elease Hashimoto in previous TE ok for pt to come and see him in [place] on 8.27.14.  Called pt and left a message for pt to call the office so we can schedule appt in [place].  Also contacted the [place] office to alert them that pt would be calling to set up an appt, they voiced understanding. So far, we have been unsuccessful in reaching pt, his phone is going straight to voicemail.

## 2012-12-04 NOTE — Telephone Encounter (Signed)
Information giving to Larry Schroeder scheduler to call pt and make the appointment in [place]  for tomorrow or Thursday.

## 2012-12-04 NOTE — Telephone Encounter (Signed)
Follow up  Just an FYI, pt is scheduled for 8.27.14 at 10:30 in [place]. Pt is aware of appt and was given the address and phone number for the [place] office.

## 2012-12-04 NOTE — Telephone Encounter (Signed)
I have openings in my schedule in [facility] tomorrow and Thursday.  Please work him into one of the appoiintments.

## 2012-12-04 NOTE — Telephone Encounter (Signed)
Pt was in [place] and is on his way to [place] now. Pt called for an appointment with Dr. Elease Hashimoto because he was in the ER there and  the ER doctor  recommended for pt to be  seen in Dr. [facility] this week. Pt has an appointment with scott Weaver on 12/20/12 pt refused to see a PA/or NP , pt is demanding to see Dr. Melburn Popper or another cardiologist in this office. Pt was made aware that DR. Nahser would not be in the [place] office until next week the 9/4 and 9/6 and his scheduled is 100 % full. Even after  giving this information, pt continues to demand to be call with an appointment.

## 2012-12-05 ENCOUNTER — Encounter: Payer: Self-pay | Admitting: Cardiovascular Disease

## 2012-12-05 ENCOUNTER — Ambulatory Visit (INDEPENDENT_AMBULATORY_CARE_PROVIDER_SITE_OTHER): Payer: Medicare Other | Admitting: Cardiovascular Disease

## 2012-12-05 VITALS — BP 142/84 | HR 81 | Ht 66.0 in | Wt 150.5 lb

## 2012-12-05 DIAGNOSIS — I10 Essential (primary) hypertension: Secondary | ICD-10-CM

## 2012-12-05 DIAGNOSIS — I4891 Unspecified atrial fibrillation: Secondary | ICD-10-CM

## 2012-12-05 DIAGNOSIS — R0602 Shortness of breath: Secondary | ICD-10-CM

## 2012-12-05 DIAGNOSIS — I2510 Atherosclerotic heart disease of native coronary artery without angina pectoris: Secondary | ICD-10-CM

## 2012-12-05 MED ORDER — RIVAROXABAN 15 MG PO TABS: 15.0000 mg | ORAL_TABLET | Freq: Every day | ORAL | Status: DC

## 2012-12-05 NOTE — Patient Instructions (Addendum)
Your physician recommends that you return for lab work in: [facility] today  Your physician has recommended you make the following change in your medication:   STOP PLAVIX START XARELTO 15 MG DAILY WITH LARGEST MEAL.  Your physician has requested that you have an echocardiogram IN [place]. Echocardiography is a painless test that uses sound waves to create images of your heart. It provides your doctor with information about the size and shape of your heart and how well your heart's chambers and valves are working. This procedure takes approximately one hour. There are no restrictions for this procedure.

## 2012-12-05 NOTE — Assessment & Plan Note (Signed)
CP was recently admitted to the hospital while he was on a cruise up near the [place] area. He was hospitalized for several days with bilateral pneumonia. He has been treated with antibiotics and is slowly improving. They noted that he was in atrial fibrillation. They did not start him on anticoagulation apparently because he was on Plavix. We have started him on Plavix years ago because of his old coronary artery bypass grafts.  His rate is well controlled. He is basically symptomatically in atrial fibrillation standpoint. He still has some shortness breath but thinks that this is because of the pneumonia and the fact it is not completely over the pneumonia.  We will start him on Xarelto 15 mg a day ( CrCl = 40).  His rate is well controlled and he does not need any AV nodal blocking agents. We'll get an echocardiogram for further evaluation of left atrium and left ventricle  size and function.  He has an appointment to see me in one month for followup.

## 2012-12-05 NOTE — Progress Notes (Signed)
Valda Lamb Date of Birth  1934-02-18       [facility] Office [address]  [address] [place], [place]  [address]   [place], [place]  [address] [phone]     [phone]   Fax  [phone]    Fax [phone]  Problem List: 1. CAD- s/p CABG 1985 - his last cath was in 1996. The saphenous vein graft to the obtuse marginal artery with a 1, first diagonal, and LAD as was widely patent. The saphenous vein graft to the right coronary artery was occluded. His native coronary arteries are occluded. A stress Myoview study in 2008 revealed inferior basilar scar. Stress Myoview study in November, 2012 revealed a similar pattern of inferior basilar ischemia with normal perfusion and the other walls. 2. chronic renal insufficiency 3. Dyslipidemia-low HDL 4. Anemia 5. Atrial fibrillation    History of Present Illness:  CP is doing well. His medical Dr. has changed some of his blood pressure medications. He was taking Avapro and Avalide. It caused his creatinine to go up slightly. He also noted that he was having an itchy rash on his arms. His medical Dr. changed him to Losartan and the rash went away and his creatinine has improved slightly.  He continues to exercise on a regular basis. He walks 3-1/2 miles a day and one hour. He exercises 5 times a week. He's not had any cardiac problems.  Some recent lab work indicated that his alkaline phosphatase was minimally elevated.  February 28, 2012: He recently had a UTI.   Aug 27, 2012:  CP is doing well.  Walks on the treadmill 5 days a week. 3.5 mph for 1 hour a day.   No angina.   Needs fasting labs.   December 05, 2012:  CP is a 79 yo with a remote hx of CAD, CABG.  He was admitted to the hospital up in [place] with bilateral pneumonia. He had a temperature of 100 and was coughing up yellow sputum.  He was also found to have a mildly elevated B. natruretic peptide was  thought to have some mild congestive heart failure. He was treated and since that time feels a little bit better.   He was also noted to be in atrial fibrillation. They treated him with Lovenox injections during hospitalization but did not change any of his medications on discharge. He was discharged on aspirin, Plavix.  He presents today feeling better but he is in atrial fibrillation. I reviewed all of our previous records and the atrial fibrillation is new. He still has shortness breath with exertion such as climbing stairs.   He has not had any fevers since being discharged from the hospital. He still has a cough and his sputum is clear  Current Outpatient Prescriptions on File Prior to Visit  Medication Sig Dispense Refill  . aspirin EC 81 MG tablet Take 81 mg by mouth daily.      Marland Kitchen atenolol (TENORMIN) 50 MG tablet Take 1 tablet (50 mg total) by mouth daily.  90 tablet  3  . B Complex-C (SUPER B COMPLEX PO) Take 1 tablet by mouth daily.      . Cholecalciferol (VITAMIN D-3 PO) Take 1,000 mg by mouth daily.        . clopidogrel (PLAVIX) 75 MG tablet Take 1 tablet (75 mg total) by mouth daily.  30 tablet  3  . Multiple Vitamins-Minerals (MULTIVITAMIN PO)  Take by mouth.        . nitroGLYCERIN (NITROSTAT) 0.4 MG SL tablet Place 0.4 mg under the tongue every 5 (five) minutes as needed.      . Omega-3 Fatty Acids (FISH OIL) 1200 MG CAPS Take by mouth.        . rosuvastatin (CRESTOR) 10 MG tablet Take 10 mg by mouth daily.        . silodosin (RAPAFLO) 8 MG CAPS capsule Take 8 mg by mouth daily with breakfast.         No current facility-administered medications on file prior to visit.    No Known Allergies  Past Medical History  Diagnosis Date  . Hypertension   . Anemia   . Hyperlipidemia   . Coronary artery disease     Status post CABG 1985    Past Surgical History  Procedure Laterality Date  . Coronary artery bypass graft    . US echocardiography  06-20-2003    EF 60-65%  . Cardiac  catheterization      History  Smoking status  . Never Smoker   Smokeless tobacco  . Not on file    History  Alcohol Use No    Family History  Problem Relation Age of Onset  . Family history unknown: Yes    Reviw of Systems:  Reviewed in the HPI.  All other systems are negative.  Physical Exam: Blood pressure 142/84, pulse 81, height 5\' 6"  (1.676 m), weight 150 lb 8 oz (68.266 kg). General: Well developed, well nourished,  He still has an occasional cough  Head: Normocephalic, atraumatic, sclera non-icteric, mucus membranes are moist,   Neck: Supple. Carotids are 2 + without bruits. No JVD  Lungs: He has rales in the bases which clear partially with subsequent deep breaths.  Heart: Irregularly irregular..  normal  S1 S2.  Soft systolic murmur.   Abdomen: Soft, non-tender, non-distended with normal bowel sounds. No hepatomegaly. No rebound/guarding. No masses.  Msk:  Strength and tone are normal  Extremities: No clubbing or cyanosis. Trace leg edema.  Distal pedal pulses are 2+ and equal bilaterally.  Neuro: Alert and oriented X 3. Moves all extremities spontaneously.  Psych:  Responds to questions appropriately with a normal affect.  ECG: 12/05/2012: Atrial fibrillation with a controlled ventricular response at 81 beats a minute. He has no ST or T wave changes. Assessment / Plan:

## 2012-12-05 NOTE — Assessment & Plan Note (Signed)
He has not had any episodes of chest pain.  Continue with his current meds

## 2012-12-06 LAB — BASIC METABOLIC PANEL
BUN: 20 mg/dL (ref 8–27)
CO2: 21 mmol/L (ref 18–29)
Chloride: 101 mmol/L (ref 97–108)
Glucose: 76 mg/dL (ref 65–99)
Potassium: 5.5 mmol/L — ABNORMAL HIGH (ref 3.5–5.2)

## 2012-12-11 ENCOUNTER — Telehealth (HOSPITAL_COMMUNITY): Payer: Self-pay | Admitting: Radiology

## 2012-12-11 ENCOUNTER — Ambulatory Visit (HOSPITAL_COMMUNITY): Payer: Medicare Other | Attending: Cardiovascular Disease | Admitting: Radiology

## 2012-12-11 DIAGNOSIS — I10 Essential (primary) hypertension: Secondary | ICD-10-CM | POA: Insufficient documentation

## 2012-12-11 DIAGNOSIS — R0609 Other forms of dyspnea: Secondary | ICD-10-CM | POA: Insufficient documentation

## 2012-12-11 DIAGNOSIS — I079 Rheumatic tricuspid valve disease, unspecified: Secondary | ICD-10-CM | POA: Insufficient documentation

## 2012-12-11 DIAGNOSIS — I059 Rheumatic mitral valve disease, unspecified: Secondary | ICD-10-CM | POA: Insufficient documentation

## 2012-12-11 DIAGNOSIS — I4891 Unspecified atrial fibrillation: Secondary | ICD-10-CM | POA: Insufficient documentation

## 2012-12-11 DIAGNOSIS — R0602 Shortness of breath: Secondary | ICD-10-CM

## 2012-12-11 DIAGNOSIS — R0989 Other specified symptoms and signs involving the circulatory and respiratory systems: Secondary | ICD-10-CM | POA: Insufficient documentation

## 2012-12-11 DIAGNOSIS — R011 Cardiac murmur, unspecified: Secondary | ICD-10-CM | POA: Insufficient documentation

## 2012-12-11 DIAGNOSIS — I379 Nonrheumatic pulmonary valve disorder, unspecified: Secondary | ICD-10-CM | POA: Insufficient documentation

## 2012-12-11 DIAGNOSIS — I2510 Atherosclerotic heart disease of native coronary artery without angina pectoris: Secondary | ICD-10-CM | POA: Insufficient documentation

## 2012-12-11 NOTE — Telephone Encounter (Signed)
Patient would like to know if he can drive.  Patient needs 90 day supply of xarelto prescription sent to a mail order drugstore.  Patient will send in address for mail order company.  Patient would like someone to call him with lab results from [place].  His phone number is [phone]

## 2012-12-11 NOTE — Telephone Encounter (Signed)
Pt has never had syncope and has not been advised by a physician to not drive. Pt said his wife was inquiring if he should drive. I told him cardiac wise he may drive but if his wife feels he is becoming a poor driver than that is another circumstance. He feels he can drive  just fine. He will call back tomorrow with new mail order pharmacy to send xarelto to.  Labs from [place] were reviewed and mailed to him.

## 2012-12-11 NOTE — Progress Notes (Signed)
Echocardiogram performed.  

## 2012-12-12 ENCOUNTER — Ambulatory Visit: Payer: Medicare Other | Admitting: Cardiovascular Disease

## 2012-12-13 ENCOUNTER — Other Ambulatory Visit: Payer: Self-pay | Admitting: *Deleted

## 2012-12-13 MED ORDER — RIVAROXABAN 15 MG PO TABS: 15.0000 mg | ORAL_TABLET | Freq: Every day | ORAL | Status: DC

## 2012-12-20 ENCOUNTER — Encounter: Payer: Medicare Other | Admitting: Physician Assistant

## 2012-12-24 ENCOUNTER — Telehealth: Payer: Self-pay | Admitting: Cardiovascular Disease

## 2012-12-24 NOTE — Telephone Encounter (Signed)
Pt read insert on xarelto yesterday and states his fears with this medication.  20 minutes was spent discussing PAF/ Afib risk also discussed coumadin and novel agents for Afib. Pt verbalized understanding and will continue med/ he has an app with Dr Elease Hashimoto this month and would like to have an EKG then. Pt was assured that an EKG will be done, pt will remain on Xarelto at this time.

## 2012-12-24 NOTE — Telephone Encounter (Signed)
Pt was given results 12/13/12/ pt wanted copy. Copy mailed.  Pt upset being on Xarelto/ states med risk is too high. Will review his concerns with Dr Elease Hashimoto

## 2012-12-24 NOTE — Telephone Encounter (Signed)
New problem   Pt want results of echocardiogram.

## 2012-12-24 NOTE — Telephone Encounter (Signed)
Error

## 2012-12-24 NOTE — Telephone Encounter (Signed)
Left msg I will try to call him later.

## 2013-01-03 ENCOUNTER — Encounter: Payer: Self-pay | Admitting: Cardiovascular Disease

## 2013-01-03 ENCOUNTER — Ambulatory Visit (INDEPENDENT_AMBULATORY_CARE_PROVIDER_SITE_OTHER): Payer: Medicare Other | Admitting: Cardiovascular Disease

## 2013-01-03 VITALS — BP 142/82 | HR 64 | Ht 66.0 in | Wt 145.0 lb

## 2013-01-03 DIAGNOSIS — I4891 Unspecified atrial fibrillation: Secondary | ICD-10-CM

## 2013-01-03 DIAGNOSIS — I2510 Atherosclerotic heart disease of native coronary artery without angina pectoris: Secondary | ICD-10-CM

## 2013-01-03 LAB — CBC WITH DIFFERENTIAL/PLATELET
Basophils Relative: 0.7 % (ref 0.0–3.0)
Eosinophils Absolute: 0.6 10*3/uL (ref 0.0–0.7)
Eosinophils Relative: 7.4 % — ABNORMAL HIGH (ref 0.0–5.0)
Lymphocytes Relative: 31.4 % (ref 12.0–46.0)
MCHC: 33.6 g/dL (ref 30.0–36.0)
MCV: 89.8 fl (ref 78.0–100.0)
Monocytes Absolute: 0.8 10*3/uL (ref 0.1–1.0)
Neutrophils Relative %: 50.5 % (ref 43.0–77.0)
Platelets: 176 10*3/uL (ref 150.0–400.0)
RBC: 4.19 Mil/uL — ABNORMAL LOW (ref 4.22–5.81)
WBC: 8.3 10*3/uL (ref 4.5–10.5)

## 2013-01-03 LAB — BASIC METABOLIC PANEL
BUN: 18 mg/dL (ref 6–23)
Chloride: 106 mEq/L (ref 96–112)
GFR: 56.42 mL/min — ABNORMAL LOW (ref >60.00)
Potassium: 4.7 mEq/L (ref 3.5–5.1)
Sodium: 138 mEq/L (ref 135–145)

## 2013-01-03 NOTE — Assessment & Plan Note (Signed)
Cad

## 2013-01-03 NOTE — Patient Instructions (Addendum)
NO CHANGES WERE MADE TODAY  Your physician has recommended that you have a Cardioversion (DCCV). Electrical Cardioversion uses a jolt of electricity to your heart either through paddles or wired patches attached to your chest. This is a controlled, usually prescheduled, procedure. Defibrillation is done under light anesthesia in the hospital, and you usually go home the day of the procedure. This is done to get your heart back into a normal rhythm. You are not awake for the procedure. Please see the instruction sheet given to you today.  Your Cardioversion is scheduled for Tuesday 01/08/13 @10  am  LABS TODAY BMP,CBC

## 2013-01-03 NOTE — Assessment & Plan Note (Addendum)
He has been on Xarelto for 1 month. Will set him up for a cardioversion on Tuesday.  Will get CBC and BMP today.  He will not need a POT since his is on xarelto.  I have told him that he needs to continue the xarelto even after the cardioversion  Will will see him in several months.

## 2013-01-03 NOTE — Progress Notes (Signed)
Larry Schroeder Date of Birth  1934/02/19       [facility] Office [address]  [address] [place], [place]  [address]   [place], [place]  [address] [phone]     [phone]   Fax  [phone]    Fax [phone]  Problem List: 1. CAD- s/p CABG 1985 - his last cath was in 1996. The saphenous vein graft to the obtuse marginal artery with a 1, first diagonal, and LAD as was widely patent. The saphenous vein graft to the right coronary artery was occluded. His native coronary arteries are occluded. A stress Myoview study in 2008 revealed inferior basilar scar. Stress Myoview study in November, 2012 revealed a similar pattern of inferior basilar ischemia with normal perfusion and the other walls. 2. chronic renal insufficiency 3. Dyslipidemia-low HDL 4. Anemia 5. Atrial fibrillation  History of Present Illness:  CP is doing well. His medical Dr. has changed some of his blood pressure medications. He was taking Avapro and Avalide. It caused his creatinine to go up slightly. He also noted that he was having an itchy rash on his arms. His medical Dr. changed him to Losartan and the rash went away and his creatinine has improved slightly.  He continues to exercise on a regular basis. He walks 3-1/2 miles a day and one hour. He exercises 5 times a week. He's not had any cardiac problems.  Some recent lab work indicated that his alkaline phosphatase was minimally elevated.  February 28, 2012: He recently had a UTI.   Aug 27, 2012:  CP is doing well.  Walks on the treadmill 5 days a week. 3.5 mph for 1 hour a day.   No angina.   Needs fasting labs.   December 05, 2012:  CP is a 79 yo with a remote hx of CAD, CABG.  He was admitted to the hospital up in [place] with bilateral pneumonia. He had a temperature of 100 and was coughing up yellow sputum.  He was also found to have a mildly elevated B. natruretic peptide was  thought to have some mild congestive heart failure. He was treated and since that time feels a little bit better.   He was also noted to be in atrial fibrillation. They treated him with Lovenox injections during hospitalization but did not change any of his medications on discharge. He was discharged on aspirin, Plavix.  He presents today feeling better but he is in atrial fibrillation. I reviewed all of our previous records and the atrial fibrillation is new. He still has shortness breath with exertion such as climbing stairs.   He has not had any fevers since being discharged from the hospital. He still has a cough and his sputum is clear.  Sept. 25, 2014:  CP is seen today for follow up of his A-Fib.    He had some concern about the side effects.  He has had a cough.   Current Outpatient Prescriptions on File Prior to Visit  Medication Sig Dispense Refill  . aspirin EC 81 MG tablet Take 81 mg by mouth daily.      Marland Kitchen atenolol (TENORMIN) 50 MG tablet Take 1 tablet (50 mg total) by mouth daily.  90 tablet  3  . B Complex-C (SUPER B COMPLEX PO) Take 1 tablet by mouth daily.      . Cholecalciferol (VITAMIN D-3 PO) Take 1,000 mg by mouth daily.        Marland Kitchen  Multiple Vitamins-Minerals (MULTIVITAMIN PO) Take by mouth.        . nitroGLYCERIN (NITROSTAT) 0.4 MG SL tablet Place 0.4 mg under the tongue every 5 (five) minutes as needed.      . Omega-3 Fatty Acids (FISH OIL) 1200 MG CAPS Take 1 capsule by mouth daily.       . Rivaroxaban (XARELTO) 15 MG TABS tablet Take 1 tablet (15 mg total) by mouth daily with supper.  90 tablet  3  . rosuvastatin (CRESTOR) 10 MG tablet Take 10 mg by mouth daily.        . silodosin (RAPAFLO) 8 MG CAPS capsule Take 8 mg by mouth daily with breakfast.         No current facility-administered medications on file prior to visit.    No Known Allergies  Past Medical History  Diagnosis Date  . Hypertension   . Anemia   . Hyperlipidemia   . Coronary artery disease      Status post CABG 1985    Past Surgical History  Procedure Laterality Date  . Coronary artery bypass graft    . US echocardiography  06-20-2003    EF 60-65%  . Cardiac catheterization      History  Smoking status  . Never Smoker   Smokeless tobacco  . Not on file    History  Alcohol Use No    No family history on file.  Reviw of Systems:  Reviewed in the HPI.  All other systems are negative.  Physical Exam: Blood pressure 142/82, pulse 64, height 5\' 6"  (1.676 m), weight 145 lb (65.772 kg). General: Well developed, well nourished,  He still has an occasional cough  Head: Normocephalic, atraumatic, sclera non-icteric, mucus membranes are moist,   Neck: Supple. Carotids are 2 + without bruits. No JVD  Lungs: He has rales in the bases which clear partially with subsequent deep breaths.  Heart: Irregularly irregular..  normal  S1 S2.  Soft systolic murmur.   Abdomen: Soft, non-tender, non-distended with normal bowel sounds. No hepatomegaly. No rebound/guarding. No masses.  Msk:  Strength and tone are normal  Extremities: No clubbing or cyanosis. Trace leg edema.  Distal pedal pulses are 2+ and equal bilaterally.  Neuro: Alert and oriented X 3. Moves all extremities spontaneously.  Psych:  Responds to questions appropriately with a normal affect.  ECG: Sept. 25, 2014:  Atrial fib at 45.  No ST or T wave changes.   Assessment / Plan:

## 2013-01-08 ENCOUNTER — Ambulatory Visit (HOSPITAL_COMMUNITY)
Admission: RE | Admit: 2013-01-08 | Discharge: 2013-01-08 | Disposition: A | Discharge status: Home / Self Care | Payer: Medicare Other | Source: Ambulatory Visit | Attending: Cardiovascular Disease | Admitting: Cardiovascular Disease

## 2013-01-08 ENCOUNTER — Encounter (HOSPITAL_COMMUNITY)
Admission: RE | Disposition: A | Discharge status: Home / Self Care | Payer: Self-pay | Source: Ambulatory Visit | Attending: Cardiovascular Disease

## 2013-01-08 ENCOUNTER — Encounter (HOSPITAL_COMMUNITY): Payer: Self-pay | Admitting: *Deleted

## 2013-01-08 ENCOUNTER — Encounter (HOSPITAL_COMMUNITY): Payer: Self-pay | Admitting: Anesthesiology

## 2013-01-08 ENCOUNTER — Ambulatory Visit (HOSPITAL_COMMUNITY): Payer: Medicare Other | Admitting: Anesthesiology

## 2013-01-08 ENCOUNTER — Telehealth: Payer: Self-pay | Admitting: *Deleted

## 2013-01-08 DIAGNOSIS — I25810 Atherosclerosis of coronary artery bypass graft(s) without angina pectoris: Secondary | ICD-10-CM | POA: Insufficient documentation

## 2013-01-08 DIAGNOSIS — D649 Anemia, unspecified: Secondary | ICD-10-CM | POA: Insufficient documentation

## 2013-01-08 DIAGNOSIS — Z79899 Other long term (current) drug therapy: Secondary | ICD-10-CM | POA: Insufficient documentation

## 2013-01-08 DIAGNOSIS — I4891 Unspecified atrial fibrillation: Secondary | ICD-10-CM

## 2013-01-08 DIAGNOSIS — I509 Heart failure, unspecified: Secondary | ICD-10-CM | POA: Insufficient documentation

## 2013-01-08 DIAGNOSIS — E785 Hyperlipidemia, unspecified: Secondary | ICD-10-CM | POA: Insufficient documentation

## 2013-01-08 DIAGNOSIS — I129 Hypertensive chronic kidney disease with stage 1 through stage 4 chronic kidney disease, or unspecified chronic kidney disease: Secondary | ICD-10-CM | POA: Insufficient documentation

## 2013-01-08 DIAGNOSIS — N189 Chronic kidney disease, unspecified: Secondary | ICD-10-CM | POA: Insufficient documentation

## 2013-01-08 DIAGNOSIS — I2510 Atherosclerotic heart disease of native coronary artery without angina pectoris: Secondary | ICD-10-CM | POA: Insufficient documentation

## 2013-01-08 DIAGNOSIS — R0683 Snoring: Secondary | ICD-10-CM

## 2013-01-08 HISTORY — PX: CARDIOVERSION: SHX1299

## 2013-01-08 SURGERY — CARDIOVERSION
Anesthesia: General | Wound class: Clean

## 2013-01-08 MED ORDER — SODIUM CHLORIDE 0.9 % IV SOLN
INTRAVENOUS | Status: DC
  Administered 2013-01-08: 09:00:00 via INTRAVENOUS

## 2013-01-08 MED ORDER — DEXTROSE-NACL 5-0.45 % IV SOLN: INTRAVENOUS | Status: DC

## 2013-01-08 MED ORDER — LIDOCAINE HCL (CARDIAC) 20 MG/ML IV SOLN
INTRAVENOUS | Status: DC | PRN
  Administered 2013-01-08: 20 mg via INTRAVENOUS

## 2013-01-08 MED ORDER — PROPOFOL 10 MG/ML IV BOLUS
INTRAVENOUS | Status: DC | PRN
  Administered 2013-01-08: 50 mg via INTRAVENOUS

## 2013-01-08 NOTE — Addendum Note (Signed)
Addendum created 01/08/13 1100 by Shireen Quan, CRNA   Modules edited: Anesthesia Responsible Staff

## 2013-01-08 NOTE — CV Procedure (Signed)
    Cardioversion Note  DONYEA BEVERLIN [id number] Jan 06, 1934  Procedure: DC Cardioversion Indications: atrial fib  Procedure Details Consent: Obtained Time Out: Verified patient identification, verified procedure, site/side was marked, verified correct patient position, special equipment/implants available, Radiology Safety Procedures followed,  medications/allergies/relevent history reviewed, required imaging and test results available.  Performed  The patient has been on adequate anticoagulation.  The patient received IV Lidocaine 20 mg IV followed by Propofol 50 mg  for sedation.  Synchronous cardioversion was performed at 120  joules.  The cardioversion was successful.     Complications: No apparent complications Patient did tolerate procedure well.   Vesta Mixer, Montez Hageman., MD, [facility] 01/08/2013, 10:37 AM

## 2013-01-08 NOTE — H&P (View-Only) (Signed)
Larry Schroeder Date of Birth  Mar 27, 1934       [facility] Office [address]  [address] [place], [place]  [address]   [place], [place]  [address] [phone]     [phone]   Fax  [phone]    Fax [phone]  Problem List: 1. CAD- s/p CABG 1985 - his last cath was in 1996. The saphenous vein graft to the obtuse marginal artery with a 1, first diagonal, and LAD as was widely patent. The saphenous vein graft to the right coronary artery was occluded. His native coronary arteries are occluded. A stress Myoview study in 2008 revealed inferior basilar scar. Stress Myoview study in November, 2012 revealed a similar pattern of inferior basilar ischemia with normal perfusion and the other walls. 2. chronic renal insufficiency 3. Dyslipidemia-low HDL 4. Anemia 5. Atrial fibrillation  History of Present Illness:  CP is doing well. His medical Dr. has changed some of his blood pressure medications. He was taking Avapro and Avalide. It caused his creatinine to go up slightly. He also noted that he was having an itchy rash on his arms. His medical Dr. changed him to Losartan and the rash went away and his creatinine has improved slightly.  He continues to exercise on a regular basis. He walks 3-1/2 miles a day and one hour. He exercises 5 times a week. He's not had any cardiac problems.  Some recent lab work indicated that his alkaline phosphatase was minimally elevated.  February 28, 2012: He recently had a UTI.   Aug 27, 2012:  CP is doing well.  Walks on the treadmill 5 days a week. 3.5 mph for 1 hour a day.   No angina.   Needs fasting labs.   December 05, 2012:  CP is a 79 yo with a remote hx of CAD, CABG.  He was admitted to the hospital up in [place] with bilateral pneumonia. He had a temperature of 100 and was coughing up yellow sputum.  He was also found to have a mildly elevated B. natruretic peptide was  thought to have some mild congestive heart failure. He was treated and since that time feels a little bit better.   He was also noted to be in atrial fibrillation. They treated him with Lovenox injections during hospitalization but did not change any of his medications on discharge. He was discharged on aspirin, Plavix.  He presents today feeling better but he is in atrial fibrillation. I reviewed all of our previous records and the atrial fibrillation is new. He still has shortness breath with exertion such as climbing stairs.   He has not had any fevers since being discharged from the hospital. He still has a cough and his sputum is clear.  Sept. 25, 2014:  CP is seen today for follow up of his A-Fib.    He had some concern about the side effects.  He has had a cough.   Current Outpatient Prescriptions on File Prior to Visit  Medication Sig Dispense Refill  . aspirin EC 81 MG tablet Take 81 mg by mouth daily.      Marland Kitchen atenolol (TENORMIN) 50 MG tablet Take 1 tablet (50 mg total) by mouth daily.  90 tablet  3  . B Complex-C (SUPER B COMPLEX PO) Take 1 tablet by mouth daily.      . Cholecalciferol (VITAMIN D-3 PO) Take 1,000 mg by mouth daily.        Marland Kitchen  Multiple Vitamins-Minerals (MULTIVITAMIN PO) Take by mouth.        . nitroGLYCERIN (NITROSTAT) 0.4 MG SL tablet Place 0.4 mg under the tongue every 5 (five) minutes as needed.      . Omega-3 Fatty Acids (FISH OIL) 1200 MG CAPS Take 1 capsule by mouth daily.       . Rivaroxaban (XARELTO) 15 MG TABS tablet Take 1 tablet (15 mg total) by mouth daily with supper.  90 tablet  3  . rosuvastatin (CRESTOR) 10 MG tablet Take 10 mg by mouth daily.        . silodosin (RAPAFLO) 8 MG CAPS capsule Take 8 mg by mouth daily with breakfast.         No current facility-administered medications on file prior to visit.    No Known Allergies  Past Medical History  Diagnosis Date  . Hypertension   . Anemia   . Hyperlipidemia   . Coronary artery disease      Status post CABG 1985    Past Surgical History  Procedure Laterality Date  . Coronary artery bypass graft    . US echocardiography  06-20-2003    EF 60-65%  . Cardiac catheterization      History  Smoking status  . Never Smoker   Smokeless tobacco  . Not on file    History  Alcohol Use No    No family history on file.  Reviw of Systems:  Reviewed in the HPI.  All other systems are negative.  Physical Exam: Blood pressure 142/82, pulse 64, height 5\' 6"  (1.676 m), weight 145 lb (65.772 kg). General: Well developed, well nourished,  He still has an occasional cough  Head: Normocephalic, atraumatic, sclera non-icteric, mucus membranes are moist,   Neck: Supple. Carotids are 2 + without bruits. No JVD  Lungs: He has rales in the bases which clear partially with subsequent deep breaths.  Heart: Irregularly irregular..  normal  S1 S2.  Soft systolic murmur.   Abdomen: Soft, non-tender, non-distended with normal bowel sounds. No hepatomegaly. No rebound/guarding. No masses.  Msk:  Strength and tone are normal  Extremities: No clubbing or cyanosis. Trace leg edema.  Distal pedal pulses are 2+ and equal bilaterally.  Neuro: Alert and oriented X 3. Moves all extremities spontaneously.  Psych:  Responds to questions appropriately with a normal affect.  ECG: Sept. 25, 2014:  Atrial fib at 71.  No ST or T wave changes.   Assessment / Plan:

## 2013-01-08 NOTE — Anesthesia Postprocedure Evaluation (Signed)
  Anesthesia Post-op Note  Patient: Larry Schroeder  Procedure(s) Performed: Procedure(s): CARDIOVERSION (N/A)  Patient Location: PACU and Endoscopy Unit  Anesthesia Type:General  Level of Consciousness: awake  Airway and Oxygen Therapy: Patient Spontanous Breathing  Post-op Pain: none  Post-op Assessment: Post-op Vital signs reviewed, Patient's Cardiovascular Status Stable, Respiratory Function Stable, Patent Airway, No signs of Nausea or vomiting and Pain level controlled  Post-op Vital Signs: Reviewed and stable  Complications: No apparent anesthesia complications

## 2013-01-08 NOTE — Anesthesia Postprocedure Evaluation (Signed)
  Anesthesia Post-op Note  Patient: Larry Schroeder  Procedure(s) Performed: Procedure(s): CARDIOVERSION (N/A)  Patient Location: Endoscopy Unit  Anesthesia Type:General  Level of Consciousness: awake, alert  and oriented  Airway and Oxygen Therapy: Patient Spontanous Breathing and Patient connected to nasal cannula oxygen  Post-op Pain: none  Post-op Assessment: Post-op Vital signs reviewed, Patient's Cardiovascular Status Stable, Respiratory Function Stable, Patent Airway and No signs of Nausea or vomiting  Post-op Vital Signs: Reviewed and stable  Complications: No apparent anesthesia complications

## 2013-01-08 NOTE — Anesthesia Preprocedure Evaluation (Addendum)
Anesthesia Evaluation  Patient identified by MRN, date of birth, ID band Patient awake    Reviewed: Allergy & Precautions, H&P , NPO status , Patient's Chart, lab work & pertinent test results, reviewed documented beta blocker date and time   Airway Mallampati: III TM Distance: >3 FB Neck ROM: Full    Dental  (+) Teeth Intact   Pulmonary  breath sounds clear to auscultation        Cardiovascular hypertension, Pt. on medications and Pt. on home beta blockers + CAD + dysrhythmias Atrial Fibrillation Rhythm:Irregular Rate:Normal     Neuro/Psych    GI/Hepatic   Endo/Other    Renal/GU Renal InsufficiencyRenal disease     Musculoskeletal   Abdominal   Peds  Hematology   Anesthesia Other Findings   Reproductive/Obstetrics                          Anesthesia Physical Anesthesia Plan  ASA: III  Anesthesia Plan: General   Post-op Pain Management:    Induction: Intravenous  Airway Management Planned: Mask  Additional Equipment:   Intra-op Plan:   Post-operative Plan:   Informed Consent: I have reviewed the patients History and Physical, chart, labs and discussed the procedure including the risks, benefits and alternatives for the proposed anesthesia with the patient or authorized representative who has indicated his/her understanding and acceptance.   Dental advisory given  Plan Discussed with: CRNA and Surgeon  Anesthesia Plan Comments:        Anesthesia Quick Evaluation

## 2013-01-08 NOTE — Telephone Encounter (Signed)
Order was placed and msg was sent to pcc to schedule. Pt informed of ordered procedure.

## 2013-01-08 NOTE — Telephone Encounter (Signed)
CP was successfully cardioverted. He snored loudly after anesthesia. His wife confirms that he snores at night and frequently has apnenic episodes. Please schedule a split night sleep study.   Vesta Mixer, Montez Hageman., MD, [facility]  01/08/2013, 10:46 AM  Office - [phone]  Pager [phone]

## 2013-01-08 NOTE — Interval H&P Note (Signed)
History and Physical Interval Note:  01/08/2013 10:34 AM  Montay P Perazzo  has presented today for surgery, with the diagnosis of A FIB  The various methods of treatment have been discussed with the patient and family. After consideration of risks, benefits and other options for treatment, the patient has consented to  Procedure(s): CARDIOVERSION (N/A) as a surgical intervention .  The patient's history has been reviewed, patient examined, no change in status, stable for surgery.  I have reviewed the patient's chart and labs.  Questions were answered to the patient's satisfaction.     Elyn Aquas.

## 2013-01-08 NOTE — Transfer of Care (Signed)
Immediate Anesthesia Transfer of Care Note  Patient: Larry Schroeder  Procedure(s) Performed: Procedure(s): CARDIOVERSION (N/A)  Patient Location: Endoscopy Unit  Anesthesia Type:General  Level of Consciousness: sedated  Airway & Oxygen Therapy: Patient Spontanous Breathing and Patient connected to nasal cannula oxygen  Post-op Assessment: Report given to PACU RN, Post -op Vital signs reviewed and stable and Patient moving all extremities  Post vital signs: Reviewed and stable  Complications: No apparent anesthesia complications

## 2013-01-08 NOTE — Preoperative (Signed)
Beta Blockers   Reason not to administer Beta Blockers:Tenormin daily

## 2013-01-09 ENCOUNTER — Encounter (HOSPITAL_COMMUNITY): Payer: Self-pay | Admitting: Cardiovascular Disease

## 2013-01-11 NOTE — Telephone Encounter (Signed)
Pt's labs done on 01/03/13 mail to pt per request.

## 2013-01-11 NOTE — Telephone Encounter (Signed)
New problem    Patient is asking for test results to be mail to him

## 2013-02-11 ENCOUNTER — Encounter (HOSPITAL_BASED_OUTPATIENT_CLINIC_OR_DEPARTMENT_OTHER): Payer: Medicare Other

## 2013-02-22 ENCOUNTER — Other Ambulatory Visit: Payer: Medicare Other

## 2013-02-25 ENCOUNTER — Telehealth: Payer: Self-pay | Admitting: *Deleted

## 2013-02-25 DIAGNOSIS — E785 Hyperlipidemia, unspecified: Secondary | ICD-10-CM

## 2013-02-25 NOTE — Telephone Encounter (Signed)
6 mo ov and lab date given.

## 2013-02-27 ENCOUNTER — Ambulatory Visit: Payer: Medicare Other | Admitting: Cardiovascular Disease

## 2013-03-04 ENCOUNTER — Other Ambulatory Visit (INDEPENDENT_AMBULATORY_CARE_PROVIDER_SITE_OTHER): Payer: Medicare Other

## 2013-03-04 DIAGNOSIS — E785 Hyperlipidemia, unspecified: Secondary | ICD-10-CM

## 2013-03-04 LAB — BASIC METABOLIC PANEL
BUN: 18 mg/dL (ref 6–23)
CO2: 25 mEq/L (ref 19–32)
Chloride: 107 mEq/L (ref 96–112)
GFR: 59.53 mL/min — ABNORMAL LOW (ref >60.00)
Potassium: 4.5 mEq/L (ref 3.5–5.1)
Sodium: 138 mEq/L (ref 135–145)

## 2013-03-04 LAB — HEPATIC FUNCTION PANEL
AST: 37 U/L (ref 0–37)
Alkaline Phosphatase: 46 U/L (ref 39–117)
Total Bilirubin: 0.8 mg/dL (ref 0.3–1.2)
Total Protein: 7.5 g/dL (ref 6.0–8.3)

## 2013-03-04 LAB — LIPID PANEL
LDL Cholesterol: 55 mg/dL (ref 0–99)
VLDL: 18.6 mg/dL (ref 0.0–40.0)

## 2013-03-12 ENCOUNTER — Encounter: Payer: Self-pay | Admitting: Cardiovascular Disease

## 2013-03-12 ENCOUNTER — Ambulatory Visit (INDEPENDENT_AMBULATORY_CARE_PROVIDER_SITE_OTHER): Payer: Medicare Other | Admitting: Cardiovascular Disease

## 2013-03-12 VITALS — BP 126/70 | HR 68 | Ht 66.0 in | Wt 145.4 lb

## 2013-03-12 DIAGNOSIS — E785 Hyperlipidemia, unspecified: Secondary | ICD-10-CM

## 2013-03-12 DIAGNOSIS — I4891 Unspecified atrial fibrillation: Secondary | ICD-10-CM

## 2013-03-12 DIAGNOSIS — I2510 Atherosclerotic heart disease of native coronary artery without angina pectoris: Secondary | ICD-10-CM

## 2013-03-12 NOTE — Assessment & Plan Note (Signed)
CP is doing well. He has maintained sinus rhythm. We'll continue with the current dose of Xarelto 15 mg.

## 2013-03-12 NOTE — Assessment & Plan Note (Signed)
CP is doing well. He's not having any episodes of chest pain or shortness breath.

## 2013-03-12 NOTE — Patient Instructions (Signed)
Your physician recommends that you return for a FASTING lipid profile: 6 month Your physician wants you to follow-up in:6 months  You will receive a reminder letter in the mail two months in advance. If you don't receive a letter, please call our office to schedule the follow-up appointment.

## 2013-03-12 NOTE — Progress Notes (Signed)
Valda Lamb Date of Birth  09/28/1933       [facility] Office [address], [address]  [address] [place], [place]  [address]   [place], [place]  [address] [phone]     [phone]   Fax  [phone]    Fax [phone]  Problem List: 1. CAD- s/p CABG 1985 - his last cath was in 1996. The saphenous vein graft to the obtuse marginal artery with a 1, first diagonal, and LAD as was widely patent. The saphenous vein graft to the right coronary artery was occluded. His native coronary arteries are occluded. A stress Myoview study in 2008 revealed inferior basilar scar. Stress Myoview study in November, 2012 revealed a similar pattern of inferior basilar ischemia with normal perfusion and the other walls. 2. chronic renal insufficiency 3. Dyslipidemia-low HDL 4. Anemia 5. Atrial fibrillation  History of Present Illness:  CP is doing well. His medical Dr. has changed some of his blood pressure medications. He was taking Avapro and Avalide. It caused his creatinine to go up slightly. He also noted that he was having an itchy rash on his arms. His medical Dr. changed him to Losartan and the rash went away and his creatinine has improved slightly.  He continues to exercise on a regular basis. He walks 3-1/2 miles a day and one hour. He exercises 5 times a week. He's not had any cardiac problems.  Some recent lab work indicated that his alkaline phosphatase was minimally elevated.  February 28, 2012: He recently had a UTI.   Aug 27, 2012:  CP is doing well.  Walks on the treadmill 5 days a week. 3.5 mph for 1 hour a day.   No angina.   Needs fasting labs.   December 05, 2012:  CP is a 79 yo with a remote hx of CAD, CABG.  He was admitted to the hospital up in [place] with bilateral pneumonia. He had a temperature of 100 and was coughing up yellow sputum.  He was also found to have a mildly elevated B. natruretic peptide was  thought to have some mild congestive heart failure. He was treated and since that time feels a little bit better.   He was also noted to be in atrial fibrillation. They treated him with Lovenox injections during hospitalization but did not change any of his medications on discharge. He was discharged on aspirin, Plavix.  He presents today feeling better but he is in atrial fibrillation. I reviewed all of our previous records and the atrial fibrillation is new. He still has shortness breath with exertion such as climbing stairs.   He has not had any fevers since being discharged from the hospital. He still has a cough and his sputum is clear.  Sept. 25, 2014:  CP is seen today for follow up of his A-Fib.    He had some concern about the side effects.  He has had a cough.   Dec. 2, 2014; CP is  doing very well. He has maintained sinus rhythm. He is walks for an hour every day, 5 days a week.  He denies any chest pain or shortness of breath. He is able to do all his normal activities without any significant problems.  Current Outpatient Prescriptions on File Prior to Visit  Medication Sig Dispense Refill  . aspirin EC 81 MG tablet Take 81 mg by mouth daily.      Marland Kitchen B  Complex-C (SUPER B COMPLEX PO) Take 1 tablet by mouth daily.      . Cholecalciferol (VITAMIN D-3 PO) Take 1,000 mg by mouth daily.        . Multiple Vitamins-Minerals (MULTIVITAMIN PO) Take by mouth.        . Omega-3 Fatty Acids (FISH OIL) 1200 MG CAPS Take 1 capsule by mouth daily.       . Rivaroxaban (XARELTO) 15 MG TABS tablet Take 1 tablet (15 mg total) by mouth daily with supper.  90 tablet  3  . rosuvastatin (CRESTOR) 10 MG tablet Take 10 mg by mouth daily.        . silodosin (RAPAFLO) 8 MG CAPS capsule Take 8 mg by mouth daily with breakfast.        . atenolol (TENORMIN) 50 MG tablet Take 1 tablet (50 mg total) by mouth daily.  90 tablet  3   No current facility-administered medications on file prior to visit.    No Known  Allergies  Past Medical History  Diagnosis Date  . Hypertension   . Anemia   . Hyperlipidemia   . Coronary artery disease     Status post CABG 1985    Past Surgical History  Procedure Laterality Date  . Coronary artery bypass graft    . US echocardiography  06-20-2003    EF 60-65%  . Cardiac catheterization    . Cardioversion N/A 01/08/2013    Procedure: CARDIOVERSION;  Surgeon: Vesta Mixer, MD;  Location: [facility] ENDOSCOPY;  Service: Cardiovascular;  Laterality: N/A;    History  Smoking status  . Never Smoker   Smokeless tobacco  . Not on file    History  Alcohol Use No    No family history on file.  Reviw of Systems:  Reviewed in the HPI.  All other systems are negative.  Physical Exam: Blood pressure 126/70, pulse 68, height 5\' 6"  (1.676 m), weight 145 lb 6.4 oz (65.953 kg). General: Well developed, well nourished,  He still has an occasional cough  Head: Normocephalic, atraumatic, sclera non-icteric, mucus membranes are moist,   Neck: Supple. Carotids are 2 + without bruits. No JVD  Lungs: He has rales in the bases which clear partially with subsequent deep breaths.  Heart: RR.  normal  S1 S2.  Soft systolic murmur.   Abdomen: Soft, non-tender, non-distended with normal bowel sounds. No hepatomegaly. No rebound/guarding. No masses.  Msk:  Strength and tone are normal  Extremities: No clubbing or cyanosis. Trace leg edema.  Distal pedal pulses are 2+ and equal bilaterally.  Neuro: Alert and oriented X 3. Moves all extremities spontaneously.  Psych:  Responds to questions appropriately with a normal affect.  ECG: OCt. 1, 2014:  NSR   Assessment / Plan:

## 2013-03-13 ENCOUNTER — Encounter (HOSPITAL_BASED_OUTPATIENT_CLINIC_OR_DEPARTMENT_OTHER): Payer: Medicare Other

## 2013-03-25 ENCOUNTER — Other Ambulatory Visit: Payer: Self-pay | Admitting: Gastroenterology

## 2013-03-25 DIAGNOSIS — R1310 Dysphagia, unspecified: Secondary | ICD-10-CM

## 2013-03-27 ENCOUNTER — Ambulatory Visit
Admission: RE | Admit: 2013-03-27 | Discharge: 2013-03-27 | Disposition: A | Discharge status: Home / Self Care | Payer: Medicare Other | Source: Ambulatory Visit | Attending: Gastroenterology | Admitting: Gastroenterology

## 2013-03-27 DIAGNOSIS — R1310 Dysphagia, unspecified: Secondary | ICD-10-CM

## 2013-04-19 ENCOUNTER — Encounter (HOSPITAL_BASED_OUTPATIENT_CLINIC_OR_DEPARTMENT_OTHER): Payer: Medicare Other

## 2013-09-11 ENCOUNTER — Ambulatory Visit: Payer: Medicare Other | Admitting: Sports Medicine

## 2013-09-24 ENCOUNTER — Other Ambulatory Visit: Payer: Self-pay | Admitting: *Deleted

## 2013-09-24 ENCOUNTER — Encounter: Payer: Self-pay | Admitting: Sports Medicine

## 2013-09-24 ENCOUNTER — Ambulatory Visit (INDEPENDENT_AMBULATORY_CARE_PROVIDER_SITE_OTHER): Payer: Commercial Managed Care - HMO | Admitting: Sports Medicine

## 2013-09-24 VITALS — BP 154/65 | Ht 66.0 in | Wt 144.0 lb

## 2013-09-24 DIAGNOSIS — M719 Bursopathy, unspecified: Secondary | ICD-10-CM

## 2013-09-24 DIAGNOSIS — M6790 Unspecified disorder of synovium and tendon, unspecified site: Secondary | ICD-10-CM

## 2013-09-24 DIAGNOSIS — M67912 Unspecified disorder of synovium and tendon, left shoulder: Secondary | ICD-10-CM

## 2013-09-24 MED ORDER — NITROGLYCERIN 0.2 MG/HR TD PT24: MEDICATED_PATCH | TRANSDERMAL | Status: DC

## 2013-09-24 NOTE — Patient Instructions (Signed)
Nitroglycerin Protocol   Apply 1/4 nitroglycerin patch to affected area daily.  Change position of patch within the affected area every 24 hours.  You may experience a headache during the first 1-2 weeks of using the patch, these should subside.  If you experience headaches after beginning nitroglycerin patch treatment, you may take your preferred over the counter pain reliever.  Another side effect of the nitroglycerin patch is skin irritation or rash related to patch adhesive.  Please notify our office if you develop more severe headaches or rash, and stop the patch.  Tendon healing with nitroglycerin patch may require 12 to 24 weeks depending on the extent of injury.  Men should not use if taking Viagra, Cialis, or Levitra.   Do not use if you have migraines or rosacea.   Use the tramadol for pain that Dr Renne CriglerPharr gave you as needed  Start exercise program and do not push through pain  See me in 1 month but you can also make an appointment for me to check your back if you wish

## 2013-09-24 NOTE — Progress Notes (Signed)
Patient ID: Larry Schroeder, male   DOB: [birth year], 80 y.o.   MRN: [id number]  Patient is a 80 year old male who is retired from [employer] He has a history of coronary artery disease and tries to exercise with walking and some easy arm exercises daily He does not recall a specific injury but began having left shoulder pain 3 months ago He saw Dr. Ophelia CharterYates and had an x-ray of the left shoulder that did not show any significant arthritis  Over the past 2 months about 50% of pain has resolved but he still gets enough pain to wake him up at night  The left arm also feels weak  He was referred for evaluation by Dr. Renne CriglerPharr  Examination No acute distress BP 154/65  Ht 5\' 6"  (1.676 m)  Wt 144 lb (65.318 kg)  BMI 23.25 kg/m2  Shoulder: Inspection reveals no abnormalities, atrophy or asymmetry. Palpation is normal with no tenderness over AC joint or bicipital groove. ROM is full in all planes. He does get a painful arc between 90 and 120 of elevation on the left  Rotator cuff strength normal for external and internal rotation Positive signs of impingement with positive Neer and Hawkin's tests, empty can.  Speeds and Yergason's tests normal but did cause slight pain No labral pathology noted with negative Obrien's,  and good stability.  Ultrasound Lift bicipital tendon shows a central split with hypoechoic change in a halo sign 4 cm below the rotator cuff Rotator cuff shows a nearly full thickness supraspinatous tear approximately 1 cm from the insertion A small insertional tear is noted as well  Infraspinatus teres minor and subscapularis are normal A.c. joint shows mild arthritis

## 2013-09-24 NOTE — Assessment & Plan Note (Signed)
Nitroglycerin protocol  Home exercise program to emphasize rotator cuff strength   Recheck in 4 weeks  I reassured him that I thought the smaller tear which seems no more than 1 cm has good potential for healing

## 2013-09-30 ENCOUNTER — Other Ambulatory Visit: Payer: Medicare Other

## 2013-09-30 ENCOUNTER — Other Ambulatory Visit: Payer: Self-pay | Admitting: *Deleted

## 2013-09-30 MED ORDER — NITROGLYCERIN 0.2 MG/HR TD PT24: MEDICATED_PATCH | TRANSDERMAL | Status: AC

## 2013-09-30 MED ORDER — NITROGLYCERIN 0.2 MG/HR TD PT24: MEDICATED_PATCH | TRANSDERMAL | Status: DC

## 2013-10-01 ENCOUNTER — Other Ambulatory Visit (INDEPENDENT_AMBULATORY_CARE_PROVIDER_SITE_OTHER): Payer: Commercial Managed Care - HMO

## 2013-10-01 DIAGNOSIS — I2510 Atherosclerotic heart disease of native coronary artery without angina pectoris: Secondary | ICD-10-CM

## 2013-10-01 DIAGNOSIS — E785 Hyperlipidemia, unspecified: Secondary | ICD-10-CM

## 2013-10-01 DIAGNOSIS — I4891 Unspecified atrial fibrillation: Secondary | ICD-10-CM

## 2013-10-01 LAB — BASIC METABOLIC PANEL
BUN: 19 mg/dL (ref 6–23)
CO2: 23 mEq/L (ref 19–32)
Calcium: 9.4 mg/dL (ref 8.4–10.5)
Chloride: 107 mEq/L (ref 96–112)
Creatinine, Ser: 1.2 mg/dL (ref 0.4–1.5)
GFR: 64.15 mL/min (ref >60.00)
Glucose, Bld: 113 mg/dL — ABNORMAL HIGH (ref 70–99)
Potassium: 4 mEq/L (ref 3.5–5.1)
SODIUM: 137 meq/L (ref 135–145)

## 2013-10-01 LAB — HEPATIC FUNCTION PANEL
ALT: 35 U/L (ref 0–53)
AST: 36 U/L (ref 0–37)
Albumin: 4 g/dL (ref 3.5–5.2)
Alkaline Phosphatase: 52 U/L (ref 39–117)
Bilirubin, Direct: 0.1 mg/dL (ref 0.0–0.3)
Total Bilirubin: 0.8 mg/dL (ref 0.2–1.2)
Total Protein: 7.2 g/dL (ref 6.0–8.3)

## 2013-10-01 LAB — LIPID PANEL
Cholesterol: 87 mg/dL (ref 0–200)
HDL: 33.2 mg/dL — AB (ref >39.00)
LDL Cholesterol: 42 mg/dL (ref 0–99)
NonHDL: 53.8
Total CHOL/HDL Ratio: 3
Triglycerides: 57 mg/dL (ref 0.0–149.0)
VLDL: 11.4 mg/dL (ref 0.0–40.0)

## 2013-10-02 ENCOUNTER — Encounter: Payer: Self-pay | Admitting: Cardiovascular Disease

## 2013-10-02 ENCOUNTER — Ambulatory Visit (INDEPENDENT_AMBULATORY_CARE_PROVIDER_SITE_OTHER): Payer: Commercial Managed Care - HMO | Admitting: Cardiovascular Disease

## 2013-10-02 VITALS — BP 150/78 | HR 50 | Ht 66.0 in | Wt 147.8 lb

## 2013-10-02 DIAGNOSIS — I480 Paroxysmal atrial fibrillation: Secondary | ICD-10-CM

## 2013-10-02 DIAGNOSIS — I2510 Atherosclerotic heart disease of native coronary artery without angina pectoris: Secondary | ICD-10-CM

## 2013-10-02 DIAGNOSIS — E785 Hyperlipidemia, unspecified: Secondary | ICD-10-CM

## 2013-10-02 DIAGNOSIS — I4891 Unspecified atrial fibrillation: Secondary | ICD-10-CM

## 2013-10-02 NOTE — Assessment & Plan Note (Signed)
He remains in normal sinus rhythm for now. Continue with the current dose of Xarelto

## 2013-10-02 NOTE — Patient Instructions (Signed)
Your physician recommends that you continue on your current medications as directed. Please refer to the Current Medication list given to you today.  Your physician recommends that you return for lab work in: 6 months on the day of or a few days before your office visit with Dr. Nahser.  You will need to FAST for this appointment - nothing to eat or drink after midnight the night before except water.  Your physician wants you to follow-up in: 6 months with Dr. Nahser.  You will receive a reminder letter in the mail two months in advance. If you don't receive a letter, please call our office to schedule the follow-up appointment.  

## 2013-10-02 NOTE — Assessment & Plan Note (Signed)
He's doing very well. He's not having any episodes of angina. He walks in the treadmill for 1 hour a day. Continue with his current medications. His lipid levels are very good.

## 2013-10-02 NOTE — Progress Notes (Signed)
Larry Schroeder Date of Birth  1933/05/17       [facility]    [place] Office [address]  [address] [place], [place]  [address]   [place], [place]  [id number] [phone]     [phone]   Fax  [phone]    Fax [phone]  Problem List: 1. CAD- s/p CABG 1985 - his last cath was in 1996. The saphenous vein graft to the obtuse marginal artery with a 1, first diagonal, and LAD as was widely patent. The saphenous vein graft to the right coronary artery was occluded. His native coronary arteries are occluded. A stress Myoview study in 2008 revealed inferior basilar scar. Stress Myoview study in November, 2012 revealed a similar pattern of inferior basilar ischemia with normal perfusion and the other walls. 2. chronic renal insufficiency 3. Dyslipidemia-low HDL 4. Anemia 5. Atrial fibrillation  History of Present Illness:  CP is doing well. His medical Dr. has changed some of his blood pressure medications. He was taking Avapro and Avalide. It caused his creatinine to go up slightly. He also noted that he was having an itchy rash on his arms. His medical Dr. changed him to Losartan and the rash went away and his creatinine has improved slightly.  He continues to exercise on a regular basis. He walks 3-1/2 miles a day and one hour. He exercises 5 times a week. He's not had any cardiac problems.  Some recent lab work indicated that his alkaline phosphatase was minimally elevated.  February 28, 2012: He recently had a UTI.   Aug 27, 2012:  CP is doing well.  Walks on the treadmill 5 days a week. 3.5 mph for 1 hour a day.   No angina.   Needs fasting labs.   December 05, 2012:  CP is a 80 yo with a remote hx of CAD, CABG.  He was admitted to the [place] in [place] with [place]. He had a temperature of 100 and was coughing up yellow sputum.  He was also found to have a mildly elevated B. natruretic peptide was  thought to have some mild congestive heart failure. He was treated and since that time feels a little bit better.   He was also noted to be in atrial fibrillation. They treated him with Lovenox injections during hospitalization but did not change any of his medications on discharge. He was discharged on aspirin, Plavix.  He presents today feeling better but he is in atrial fibrillation. I reviewed all of our previous records and the atrial fibrillation is new. He still has shortness breath with exertion such as climbing stairs.   He has not had any fevers since being discharged from the hospital. He still has a cough and his sputum is clear.  Sept. 25, 2014:  CP is seen today for follow up of his A-Fib.    He had some concern about the side effects.  He has had a cough.   Dec. 2, 2014; CP is  doing very well. He has maintained sinus rhythm. He is walks for an hour every day, 5 days a week.  He denies any chest pain or shortness of breath. He is able to do all his normal activities without any significant problems.  October 02, 2013:  He is doing well.  Walks for 1 hour a day on the treadmill. He has some left arm pain.  He has seen Dr. Darrick PennaFields.   He  has some fatigue.    BP is a bit high today.  His readings have been normAL.  Current Outpatient Prescriptions on File Prior to Visit  Medication Sig Dispense Refill  . aspirin EC 81 MG tablet Take 81 mg by mouth daily.      Marland Kitchen. atenolol (TENORMIN) 50 MG tablet Take 1 tablet (50 mg total) by mouth daily.  90 tablet  3  . B Complex-C (SUPER B COMPLEX PO) Take 1 tablet by mouth daily.      . Cholecalciferol (VITAMIN D-3 PO) Take 1,000 mg by mouth daily.        . Multiple Vitamins-Minerals (MULTIVITAMIN PO) Take by mouth.        . nitroGLYCERIN (NITRODUR - DOSED IN MG/24 HR) 0.2 mg/hr patch Use 1/2 patch to left shoulder daily for 24 hours  90 patch  2  . Omega-3 Fatty Acids (FISH OIL) 1200 MG CAPS Take 1 capsule by mouth daily.       . Rivaroxaban  (XARELTO) 15 MG TABS tablet Take 1 tablet (15 mg total) by mouth daily with supper.  90 tablet  3  . rosuvastatin (CRESTOR) 10 MG tablet Take 10 mg by mouth daily.        . silodosin (RAPAFLO) 8 MG CAPS capsule Take 8 mg by mouth daily with breakfast.         No current facility-administered medications on file prior to visit.    No Known Allergies  Past Medical History  Diagnosis Date  . Hypertension   . Anemia   . Hyperlipidemia   . Coronary artery disease     Status post CABG 1985    Past Surgical History  Procedure Laterality Date  . Coronary artery bypass graft    . [place] echocardiography  06-20-2003    EF 60-65%  . Cardiac catheterization    . Cardioversion N/A 01/08/2013    Procedure: CARDIOVERSION;  Surgeon: Vesta MixerPhilip J Nahser, MD;  [facility];  Service: Cardiovascular;  Laterality: N/A;    History  Smoking status  . Never Smoker   Smokeless tobacco  . Not on file    History  Alcohol Use No    No family history on file.  Reviw of Systems:  Reviewed in the HPI.  All other systems are negative.  Physical Exam: Blood pressure 150/78, pulse 50, height 5\' 6"  (1.676 m), weight 147 lb 12.8 oz (67.042 kg). General: Well developed, well nourished,  He still has an occasional cough  Head: Normocephalic, atraumatic, sclera non-icteric, mucus membranes are moist,   Neck: Supple. Carotids are 2 + without bruits. No JVD  Lungs: He has rales in the bases which clear partially with subsequent deep breaths.  Heart: RR.  normal  S1 S2.  Soft systolic murmur.   Abdomen: Soft, non-tender, non-distended with normal bowel sounds. No hepatomegaly. No rebound/guarding. No masses.  Msk:  Strength and tone are normal  Extremities: No clubbing or cyanosis. Trace leg edema.  Distal pedal pulses are 2+ and equal bilaterally.  Neuro: Alert and oriented X 3. Moves all extremities spontaneously.  Psych:  Responds to questions appropriately with a normal  affect.  ECG: OCt. 1, 2014:  NSR   Assessment / Plan:

## 2013-10-21 ENCOUNTER — Ambulatory Visit: Payer: Commercial Managed Care - HMO | Admitting: Family Medicine

## 2013-10-23 ENCOUNTER — Encounter: Payer: Self-pay | Admitting: Family Medicine

## 2013-10-23 ENCOUNTER — Ambulatory Visit (INDEPENDENT_AMBULATORY_CARE_PROVIDER_SITE_OTHER): Payer: Commercial Managed Care - HMO | Admitting: Family Medicine

## 2013-10-23 VITALS — BP 190/75 | HR 54 | Ht 66.0 in | Wt 143.0 lb

## 2013-10-23 DIAGNOSIS — M79609 Pain in unspecified limb: Secondary | ICD-10-CM

## 2013-10-23 DIAGNOSIS — M79644 Pain in right finger(s): Secondary | ICD-10-CM

## 2013-10-23 DIAGNOSIS — M6530 Trigger finger, unspecified finger: Secondary | ICD-10-CM

## 2013-10-23 MED ORDER — METHYLPREDNISOLONE ACETATE 40 MG/ML IJ SUSP
40.0000 mg | Freq: Once | INTRAMUSCULAR | Status: AC
  Administered 2013-10-23: 20 mg via INTRA_ARTICULAR

## 2013-10-23 NOTE — Patient Instructions (Signed)
You have a trigger finger. You were given an injection for this today. Icing as needed. Ibuprofen, aleve as needed for pain. If you haven't heard from us in a week about the records, give us a call - we will see you in follow up after we get these and to examine your hips.

## 2013-10-28 ENCOUNTER — Encounter: Payer: Self-pay | Admitting: Family Medicine

## 2013-10-28 DIAGNOSIS — M6530 Trigger finger, unspecified finger: Secondary | ICD-10-CM | POA: Insufficient documentation

## 2013-10-28 NOTE — Assessment & Plan Note (Signed)
Right 3rd digit trigger finger - discussed options and he would like to go ahead with injection which was done today.  Icing, nsaids.  After informed written consent patient was seated in chair.  Area overlying right 3rd A1 pulley prepped with alcohol swab then injected with 0.5:0.575mL marcaine: depomedrol.  Patient tolerated procedure well without immediate complications.

## 2013-10-28 NOTE — Progress Notes (Signed)
Patient ID: Larry Schroeder, male   DOB: [birth year], 80 y.o.   MRN: [id number]  PCP: Londell Moh, MD  Subjective:   HPI: Patient is a 80 y.o. male here for right 3rd finger pain.  Patient reports over past several weeks he has had locking of right 3rd digit. No swelling. Is right handed. Had similar problem in past - shots stopped working and had surgery. No other complaints.  Past Medical History  Diagnosis Date  . Hypertension   . Anemia   . Hyperlipidemia   . Coronary artery disease     Status post CABG 1985    Current Outpatient Prescriptions on File Prior to Visit  Medication Sig Dispense Refill  . aspirin EC 81 MG tablet Take 81 mg by mouth daily.      Marland Kitchen atenolol (TENORMIN) 50 MG tablet Take 1 tablet (50 mg total) by mouth daily.  90 tablet  3  . B Complex-C (SUPER B COMPLEX PO) Take 1 tablet by mouth daily.      . Cholecalciferol (VITAMIN D-3 PO) Take 1,000 mg by mouth daily.        . Multiple Vitamins-Minerals (MULTIVITAMIN PO) Take by mouth.        . nitroGLYCERIN (NITRODUR - DOSED IN MG/24 HR) 0.2 mg/hr patch Use 1/2 patch to left shoulder daily for 24 hours  90 patch  2  . Omega-3 Fatty Acids (FISH OIL) 1200 MG CAPS Take 1 capsule by mouth daily.       . Rivaroxaban (XARELTO) 15 MG TABS tablet Take 1 tablet (15 mg total) by mouth daily with supper.  90 tablet  3  . rosuvastatin (CRESTOR) 10 MG tablet Take 10 mg by mouth daily.        . silodosin (RAPAFLO) 8 MG CAPS capsule Take 8 mg by mouth daily with breakfast.         No current facility-administered medications on file prior to visit.    Past Surgical History  Procedure Laterality Date  . Coronary artery bypass graft    . US echocardiography  06-20-2003    EF 60-65%  . Cardiac catheterization    . Cardioversion N/A 01/08/2013    Procedure: CARDIOVERSION;  Surgeon: Vesta Mixer, MD;  Location: [facility] ENDOSCOPY;  Service: Cardiovascular;  Laterality: N/A;    No Known Allergies  History    Social History  . Marital Status: Married    Spouse Name: N/A    Number of Children: N/A  . Years of Education: N/A   Occupational History  . Not on file.   Social History Main Topics  . Smoking status: Never Smoker   . Smokeless tobacco: Not on file  . Alcohol Use: No  . Drug Use: No  . Sexual Activity: Not on file   Other Topics Concern  . Not on file   Social History Narrative  . No narrative on file    No family history on file.  BP 190/75  Pulse 54  Ht 5\' 6"  (1.676 m)  Wt 143 lb (64.864 kg)  BMI 23.09 kg/m2  Review of Systems: See HPI above.    Objective:  Physical Exam:  Gen: NAD  Right hand: Small palpable painful nodule at A1 pulley 3rd digit right hand.  No swelling, other deformity. No other TTP hand. FROM digits.  Locking and catching with flexion of 3rd digit. NVI distally.    Assessment & Plan:  1. Right 3rd digit trigger finger - discussed options and he  would like to go ahead with injection which was done today.  Icing, nsaids.  After informed written consent patient was seated in chair.  Area overlying right 3rd A1 pulley prepped with alcohol swab then injected with 0.5:0.525mL marcaine: depomedrol.  Patient tolerated procedure well without immediate complications.  He asked about his hips - requesting records from prior orthopedic then will see him to evaluate.

## 2013-11-12 ENCOUNTER — Telehealth: Payer: Self-pay | Admitting: Cardiovascular Disease

## 2013-11-12 NOTE — Telephone Encounter (Signed)
Patient phone in c/o "a little" chest pain since yesterday and requesting to be seen.  Stated it was not serious but just wanted to checked out. Patient did walk three miles on treadmill this morning, denies pain being worse with exertion. Denies any shortness of breath or radiating pain. There is a language barrier with patient. Explained to patient would look into an appointment and call back.  Patient was called back and he had made an appointment with his PCP for today. When patient was called back did rate his pain 6 out of 10 with 10 being the worst. Patient to call back with update after seeing his PCP

## 2013-11-12 NOTE — Telephone Encounter (Signed)
New Message  Pt called having chest pain on the left side of his chest. Since Monday. Experiencing pain at the current moment. Requests a call back to discuss..Marland Kitchen

## 2013-11-18 ENCOUNTER — Other Ambulatory Visit: Payer: Self-pay | Admitting: Endocrinology

## 2013-11-18 DIAGNOSIS — R9389 Abnormal findings on diagnostic imaging of other specified body structures: Secondary | ICD-10-CM

## 2013-11-18 DIAGNOSIS — R918 Other nonspecific abnormal finding of lung field: Secondary | ICD-10-CM

## 2013-11-25 ENCOUNTER — Ambulatory Visit
Admission: RE | Admit: 2013-11-25 | Discharge: 2013-11-25 | Disposition: A | Discharge status: Home / Self Care | Payer: Commercial Managed Care - HMO | Source: Ambulatory Visit | Attending: Endocrinology | Admitting: Endocrinology

## 2013-11-25 DIAGNOSIS — R9389 Abnormal findings on diagnostic imaging of other specified body structures: Secondary | ICD-10-CM

## 2013-11-25 DIAGNOSIS — R918 Other nonspecific abnormal finding of lung field: Secondary | ICD-10-CM

## 2013-11-26 ENCOUNTER — Encounter: Payer: Self-pay | Admitting: Sports Medicine

## 2013-11-26 ENCOUNTER — Ambulatory Visit (INDEPENDENT_AMBULATORY_CARE_PROVIDER_SITE_OTHER): Payer: Commercial Managed Care - HMO | Admitting: Sports Medicine

## 2013-11-26 VITALS — BP 124/94 | Ht 65.0 in | Wt 143.0 lb

## 2013-11-26 DIAGNOSIS — M67912 Unspecified disorder of synovium and tendon, left shoulder: Secondary | ICD-10-CM

## 2013-11-26 DIAGNOSIS — M6790 Unspecified disorder of synovium and tendon, unspecified site: Secondary | ICD-10-CM

## 2013-11-26 DIAGNOSIS — M6530 Trigger finger, unspecified finger: Secondary | ICD-10-CM

## 2013-11-26 DIAGNOSIS — M719 Bursopathy, unspecified: Secondary | ICD-10-CM

## 2013-11-26 NOTE — Assessment & Plan Note (Signed)
He has a full grip and appears to have resolved the symptoms after injection for his trigger finger

## 2013-11-26 NOTE — Patient Instructions (Signed)
Nitroglycerin Protocol   Apply 1/4 nitroglycerin patch to affected area daily.  Change position of patch within the affected area every 24 hours.  You may experience a headache during the first 1-2 weeks of using the patch, these should subside.  If you experience headaches after beginning nitroglycerin patch treatment, you may take your preferred over the counter pain reliever.  Another side effect of the nitroglycerin patch is skin irritation or rash related to patch adhesive.  Please notify our office if you develop more severe headaches or rash, and stop the patch.  Tendon healing with nitroglycerin patch may require 12 to 24 weeks depending on the extent of injury.  Men should not use if taking Viagra, Cialis, or Levitra.   Do not use if you have migraines or rosacea.   Shoulder looks much better from rotator cuff partial tear and biceps tendon partial tear  Keep up exercises for shoulder  We will see what Dr Pearletha ForgeHudnall finds about records of low back but I think it is some arthritis Do some forward flexion stretches while seated

## 2013-11-26 NOTE — Assessment & Plan Note (Signed)
This is much improved and we will continue on the nitroglycerin protocol for 2 more months  Keep up his shoulder exercise routine

## 2013-11-26 NOTE — Progress Notes (Signed)
   Subjective:    Patient ID: Larry Schroeder, male    DOB: May 03, 1933, 80 y.o.   MRN: [id number]  HPI Larry Schroeder returns to clinic for follow up of left partial rotator cuff tear. He reports feeling much better today. With abduction about his head, he has 1/10 shoulder pain. Otherwise, the pain is very minimal. He has been doing shoulder exercises daily to emphasize peri-scapular mm and works with a Psychologist, educationaltrainer at [employer] 5 days a week. He avoids any overhead shoulder exercises or another movements that causes pain. He also has been doing the nitroglycerin patch - though he is unsure about the dosing. He currently uses about 1 patch every 2-3 days.   He also complains of right buttock pain. It is a sharp/stabbing, intermittent pain. It doesn't radiate anywhere. Denies numbness or tingling. Relieved by sitting. He tried putting his nitro patch on there which also gave him some relief.   Injection of trigger finger by Dr Pearletha ForgeHudnall completely resolved his sxs.  Note Dr Pearletha ForgeHudnall is getting records about his back issue.   Review of Systems     Objective:   Physical Exam Gen: well-dressed, well-groomed elderly man sitting comfortably BP 124/94  Ht 5\' 5"  (1.651 m)  Wt 143 lb (64.864 kg)  BMI 23.80 kg/m2   Left shoulder: full range of motion with very minimal discomfort with abduction beyond 90 degrees. Strength improved and equal to unaffected right arm. Negative empty can. Hawkins now neg as well.  Right leg: negative straight leg. Stiffness with FABER test.  Left leg: negative straight leg. Stiffness with FABER test.  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

## 2013-12-23 ENCOUNTER — Ambulatory Visit (INDEPENDENT_AMBULATORY_CARE_PROVIDER_SITE_OTHER): Payer: Commercial Managed Care - HMO | Admitting: Family Medicine

## 2013-12-23 ENCOUNTER — Encounter: Payer: Self-pay | Admitting: Family Medicine

## 2013-12-23 VITALS — BP 167/75 | HR 56 | Ht 65.0 in | Wt 143.0 lb

## 2013-12-23 DIAGNOSIS — M6530 Trigger finger, unspecified finger: Secondary | ICD-10-CM

## 2013-12-23 MED ORDER — METHYLPREDNISOLONE ACETATE 40 MG/ML IJ SUSP
40.0000 mg | Freq: Once | INTRAMUSCULAR | Status: AC
  Administered 2013-12-23: 20 mg via INTRA_ARTICULAR

## 2013-12-23 NOTE — Patient Instructions (Signed)
You have a trigger finger. You were given an injection for this today. Icing as needed. Ibuprofen, aleve as needed for pain. If you haven't heard from [place] in a week about the records, give [place] a call - we will see you in follow up after we get these and to examine your hips.

## 2013-12-24 ENCOUNTER — Encounter: Payer: Self-pay | Admitting: Family Medicine

## 2013-12-24 NOTE — Progress Notes (Signed)
Patient ID: Larry Schroeder, male   DOB: [birth year], 80 y.o.   MRN: [id number]  PCP: Londell Moh, MD  Subjective:   HPI: Patient is a 80 y.o. male here for right 3rd finger pain.  10/23/13: Patient reports over past several weeks he has had locking of right 3rd digit. No swelling. Is right handed. Had similar problem in past - shots stopped working and had surgery. No other complaints.  9/14: Patient reports right 3rd digit doing well. However over past 2-3 weeks having same locking issue with his thumb. No new injuries. No swelling or bruising. No other complaints.  Past Medical History  Diagnosis Date  . Hypertension   . Anemia   . Hyperlipidemia   . Coronary artery disease     Status post CABG 1985    Current Outpatient Prescriptions on File Prior to Visit  Medication Sig Dispense Refill  . aspirin EC 81 MG tablet Take 81 mg by mouth daily.      Marland Kitchen atenolol (TENORMIN) 50 MG tablet Take 1 tablet (50 mg total) by mouth daily.  90 tablet  3  . B Complex-C (SUPER B COMPLEX PO) Take 1 tablet by mouth daily.      . Cholecalciferol (VITAMIN D-3 PO) Take 1,000 mg by mouth daily.        . Multiple Vitamins-Minerals (MULTIVITAMIN PO) Take by mouth.        . nitroGLYCERIN (NITRODUR - DOSED IN MG/24 HR) 0.2 mg/hr patch Use 1/2 patch to left shoulder daily for 24 hours  90 patch  2  . Omega-3 Fatty Acids (FISH OIL) 1200 MG CAPS Take 1 capsule by mouth daily.       . Rivaroxaban (XARELTO) 15 MG TABS tablet Take 1 tablet (15 mg total) by mouth daily with supper.  90 tablet  3  . rosuvastatin (CRESTOR) 10 MG tablet Take 10 mg by mouth daily.        . silodosin (RAPAFLO) 8 MG CAPS capsule Take 8 mg by mouth daily with breakfast.         No current facility-administered medications on file prior to visit.    Past Surgical History  Procedure Laterality Date  . Coronary artery bypass graft    . US echocardiography  06-20-2003    EF 60-65%  . Cardiac catheterization     . Cardioversion N/A 01/08/2013    Procedure: CARDIOVERSION;  Surgeon: Vesta Mixer, MD;  Location: [facility] ENDOSCOPY;  Service: Cardiovascular;  Laterality: N/A;    No Known Allergies  History   Social History  . Marital Status: Married    Spouse Name: N/A    Number of Children: N/A  . Years of Education: N/A   Occupational History  . Not on file.   Social History Main Topics  . Smoking status: Never Smoker   . Smokeless tobacco: Not on file  . Alcohol Use: No  . Drug Use: No  . Sexual Activity: Not on file   Other Topics Concern  . Not on file   Social History Narrative  . No narrative on file    No family history on file.  BP 167/75  Pulse 56  Ht  (1.651 m)  Wt 143 lb (64.864 kg)  BMI 23.80 kg/m2  Review of Systems: See HPI above.    Objective:  Physical Exam:  Gen: NAD  Right hand: Small palpable painful nodule at A1 pulley 1st digit right hand.  No swelling, other deformity. No other TTP  hand. FROM digits.  Locking and catching with flexion of 1st digit, painful. NVI distally.    Assessment & Plan:  1. Right 1st digit trigger finger - discussed options and he would like to go ahead with injection which was done today.  Icing, nsaids.  After informed written consent patient was seated in chair.  Area overlying right 1st A1 pulley prepped with alcohol swab then injected with 0.5:0.18mL marcaine: depomedrol.  Patient tolerated procedure well without immediate complications.

## 2013-12-24 NOTE — Assessment & Plan Note (Signed)
Right 1st digit trigger finger - discussed options and he would like to go ahead with injection which was done today.  Icing, nsaids.  After informed written consent patient was seated in chair.  Area overlying right 1st A1 pulley prepped with alcohol swab then injected with 0.5:0.27mL marcaine: depomedrol.  Patient tolerated procedure well without immediate complications.

## 2014-01-21 ENCOUNTER — Telehealth: Payer: Self-pay | Admitting: Cardiovascular Disease

## 2014-01-21 ENCOUNTER — Encounter: Payer: Self-pay | Admitting: Cardiovascular Disease

## 2014-01-21 NOTE — Telephone Encounter (Signed)
Please put in new orders for labs if needed for November appt//sr

## 2014-02-17 ENCOUNTER — Other Ambulatory Visit (INDEPENDENT_AMBULATORY_CARE_PROVIDER_SITE_OTHER): Payer: Commercial Managed Care - HMO | Admitting: *Deleted

## 2014-02-17 DIAGNOSIS — E785 Hyperlipidemia, unspecified: Secondary | ICD-10-CM

## 2014-02-17 LAB — BASIC METABOLIC PANEL
BUN: 19 mg/dL (ref 6–23)
CHLORIDE: 105 meq/L (ref 96–112)
CO2: 21 meq/L (ref 19–32)
CREATININE: 1.2 mg/dL (ref 0.4–1.5)
Calcium: 9.5 mg/dL (ref 8.4–10.5)
GFR: 61.07 mL/min (ref >60.00)
GLUCOSE: 109 mg/dL — AB (ref 70–99)
Potassium: 4.6 mEq/L (ref 3.5–5.1)
Sodium: 138 mEq/L (ref 135–145)

## 2014-02-17 LAB — LIPID PANEL
CHOL/HDL RATIO: 3
Cholesterol: 100 mg/dL (ref 0–200)
HDL: 30.2 mg/dL — AB (ref >39.00)
LDL Cholesterol: 54 mg/dL (ref 0–99)
NONHDL: 69.8
Triglycerides: 79 mg/dL (ref 0.0–149.0)
VLDL: 15.8 mg/dL (ref 0.0–40.0)

## 2014-02-17 LAB — HEPATIC FUNCTION PANEL
ALBUMIN: 3.4 g/dL — AB (ref 3.5–5.2)
ALK PHOS: 59 U/L (ref 39–117)
ALT: 26 U/L (ref 0–53)
AST: 30 U/L (ref 0–37)
BILIRUBIN TOTAL: 0.6 mg/dL (ref 0.2–1.2)
Bilirubin, Direct: 0 mg/dL (ref 0.0–0.3)
Total Protein: 7.5 g/dL (ref 6.0–8.3)

## 2014-02-18 ENCOUNTER — Ambulatory Visit: Payer: Commercial Managed Care - HMO | Admitting: Cardiovascular Disease

## 2014-02-19 ENCOUNTER — Telehealth: Payer: Self-pay | Admitting: Cardiovascular Disease

## 2014-02-19 NOTE — Telephone Encounter (Signed)
Walk-In Patient form received: Requesting lab results and copy of test to be mailed to him: Placed in the HIM dept for Denny PeonKim M to process:djc

## 2014-02-20 ENCOUNTER — Ambulatory Visit (INDEPENDENT_AMBULATORY_CARE_PROVIDER_SITE_OTHER): Payer: Commercial Managed Care - HMO | Admitting: Cardiovascular Disease

## 2014-02-20 ENCOUNTER — Encounter: Payer: Self-pay | Admitting: Cardiovascular Disease

## 2014-02-20 VITALS — BP 134/68 | HR 55 | Ht 65.0 in | Wt 145.4 lb

## 2014-02-20 DIAGNOSIS — I10 Essential (primary) hypertension: Secondary | ICD-10-CM

## 2014-02-20 DIAGNOSIS — I25810 Atherosclerosis of coronary artery bypass graft(s) without angina pectoris: Secondary | ICD-10-CM

## 2014-02-20 DIAGNOSIS — I2510 Atherosclerotic heart disease of native coronary artery without angina pectoris: Secondary | ICD-10-CM

## 2014-02-20 DIAGNOSIS — I480 Paroxysmal atrial fibrillation: Secondary | ICD-10-CM

## 2014-02-20 MED ORDER — NITROGLYCERIN 0.4 MG SL SUBL: 0.4000 mg | SUBLINGUAL_TABLET | SUBLINGUAL | Status: DC | PRN

## 2014-02-20 MED ORDER — RIVAROXABAN 15 MG PO TABS: 15.0000 mg | ORAL_TABLET | Freq: Every day | ORAL | Status: AC

## 2014-02-20 NOTE — Assessment & Plan Note (Signed)
BP has been well controlled.  

## 2014-02-20 NOTE — Patient Instructions (Signed)
Your physician recommends that you continue on your current medications as directed. Please refer to the Current Medication list given to you today.  Your physician recommends that you schedule a follow-up appointment in: as needed with Dr. Nahser  

## 2014-02-20 NOTE — Assessment & Plan Note (Signed)
He has done remarkably well from a coronary standpoint. His bypass grafts are now 78 years old. He's not had any significant angina. He exercises on a regular basis and takes his medicines religiously. His lipid levels have been very good.  Since [place] is moving to [place], will not make him a return appointment but will be happy to see him again if needed.

## 2014-02-20 NOTE — Assessment & Plan Note (Signed)
Remains in normal sinus rhythm. Continue Xarelto

## 2014-02-20 NOTE — Progress Notes (Signed)
Larry Schroeder Date of Birth  Jul 24, 1933       [facility]    [place] [facility] [address], [address]  [address] [place], [place]  [address]   [place]  [id number] [phone]     [phone]   Fax  [phone]    Fax [phone]  Problem List: 1. CAD- s/p CABG 1985 - his last cath was in 1996. The saphenous vein graft to the obtuse marginal artery with a 1, first diagonal, and LAD as was widely patent. The saphenous vein graft to the right coronary artery was occluded. His native coronary arteries are occluded. A stress Myoview study in 2008 revealed inferior basilar scar. Stress Myoview study in November, 2012 revealed a similar pattern of inferior basilar ischemia with normal perfusion and the other walls. 2. chronic renal insufficiency 3. Dyslipidemia-low HDL 4. Anemia 5. Atrial fibrillation  History of Present Illness:  CP is doing well. His medical Dr. has changed some of his blood pressure medications. He was taking Avapro and Avalide. It caused his creatinine to go up slightly. He also noted that he was having an itchy rash on his arms. His medical Dr. changed him to Losartan and the rash went away and his creatinine has improved slightly.  He continues to exercise on a regular basis. He walks 3-1/2 miles a day and one hour. He exercises 5 times a week. He's not had any cardiac problems.  Some recent lab work indicated that his alkaline phosphatase was minimally elevated.  February 28, 2012: He recently had a UTI.   Aug 27, 2012:  CP is doing well.  Walks on the treadmill 5 days a week. 3.5 mph for 1 hour a day.   No angina.   Needs fasting labs.   December 05, 2012:  CP is a 80 yo with a remote hx of CAD, CABG.  [place] with [place]. He had a temperature of 100 and was coughing up yellow sputum.  He was also found to have a mildly elevated B. natruretic peptide was  thought to have some mild congestive heart failure. He was treated and since that time feels a little bit better.   He was also noted to be in atrial fibrillation. They treated him with Lovenox injections during hospitalization but did not change any of his medications on discharge. He was discharged on aspirin, Plavix.  He presents today feeling better but he is in atrial fibrillation. I reviewed all of our previous records and the atrial fibrillation is new. He still has shortness breath with exertion such as climbing stairs.   He has not had any fevers since being discharged from the hospital. He still has a cough and his sputum is clear.  Sept. 25, 2014:  CP is seen today for follow up of his A-Fib.    He had some concern about the side effects.  He has had a cough.   Dec. 2, 2014; CP is  doing very well. He has maintained sinus rhythm. He is walks for an hour every day, 5 days a week.  He denies any chest pain or shortness of breath. He is able to do all his normal activities without any significant problems.  October 02, 2013:  He is doing well.  Walks for 1 hour a day on the treadmill. He has some left arm pain.  He has seen Dr. Darrick PennaFields.   He  has some fatigue.    BP is a bit high today.  His readings have been normAL.  Nov. 12, 2015:  CP is doing well.   Has maintained NSR .  He has atrial fib in the past and was cardioverted.   Has not had any further episodes of AFib since then.  Walks 3.3 miles a day - 5 days a week at the [facility] in [place].   He takes his BP on a regular basis.  Always in the good range  He and his wife are planning on moving to [place].  He's not sure to be coming back [place]. He will be trying to find a doctor down in [place].  Current Outpatient Prescriptions on File Prior to Visit  Medication Sig Dispense Refill  . aspirin EC 81 MG tablet Take 81 mg by mouth daily.    . B Complex-C (SUPER B COMPLEX PO) Take 1 tablet by mouth daily.    . Cholecalciferol  (VITAMIN D-3 PO) Take 1,000 mg by mouth daily.      . Multiple Vitamins-Minerals (MULTIVITAMIN PO) Take by mouth.      . nitroGLYCERIN (NITRODUR - DOSED IN MG/24 HR) 0.2 mg/hr patch Use 1/2 patch to left shoulder daily for 24 hours 90 patch 2  . Omega-3 Fatty Acids (FISH OIL) 1200 MG CAPS Take 1 capsule by mouth daily.     . Rivaroxaban (XARELTO) 15 MG TABS tablet Take 1 tablet (15 mg total) by mouth daily with supper. 90 tablet 3  . rosuvastatin (CRESTOR) 10 MG tablet Take 10 mg by mouth daily.      . silodosin (RAPAFLO) 8 MG CAPS capsule Take 8 mg by mouth daily with breakfast.      . atenolol (TENORMIN) 50 MG tablet Take 1 tablet (50 mg total) by mouth daily. 90 tablet 3   No current facility-administered medications on file prior to visit.    No Known Allergies  Past Medical History  Diagnosis Date  . Hypertension   . Anemia   . Hyperlipidemia   . Coronary artery disease     Status post CABG 1985    Past Surgical History  Procedure Laterality Date  . Coronary artery bypass graft    . US echocardiography  06-20-2003    EF 60-65%  . Cardiac catheterization    . Cardioversion N/A 01/08/2013    Procedure: CARDIOVERSION;  Surgeon: Vesta Mixer, MD;  Location: [facility] ENDOSCOPY;  Service: Cardiovascular;  Laterality: N/A;    History  Smoking status  . Never Smoker   Smokeless tobacco  . Not on file    History  Alcohol Use No    No family history on file.  Reviw of Systems:  Reviewed in the HPI.  All other systems are negative.  Physical Exam: Blood pressure 134/68, pulse 55, height 5\' 5"  (1.651 m), weight 145 lb 6.4 oz (65.953 kg). General: Well developed, well nourished,  He still has an occasional cough  Head: Normocephalic, atraumatic, sclera non-icteric, mucus membranes are moist,   Neck: Supple. Carotids are 2 + without bruits. No JVD  Lungs: He has rales in the bases which clear partially with subsequent deep breaths.  Heart: RR.  normal  S1 S2.  Soft  systolic murmur.   Abdomen: Soft, non-tender, non-distended with normal bowel sounds. No hepatomegaly. No rebound/guarding. No masses.  Msk:  Strength and tone are normal  Extremities: No clubbing or cyanosis. Trace leg edema.  Distal pedal pulses are 2+ and equal  bilaterally.  Neuro: Alert and oriented X 3. Moves all extremities spontaneously.  Psych:  Responds to questions appropriately with a normal affect.  ECG: Nov. 12, 2015:  Sinus brady at 55. 1st degree AV block.   Assessment / Plan:

## 2014-02-24 NOTE — Telephone Encounter (Signed)
Walk-In Patient Form received at check-in: patient is requesting lab results and a copy of test to be mailed to him: no current release of information on file: mailed to current address:djc

## 2014-03-02 ENCOUNTER — Emergency Department (HOSPITAL_BASED_OUTPATIENT_CLINIC_OR_DEPARTMENT_OTHER)
Admission: EM | Admit: 2014-03-02 | Discharge: 2014-03-02 | Disposition: A | Discharge status: Home / Self Care | Payer: Medicare HMO | Attending: Emergency Medicine | Admitting: Emergency Medicine

## 2014-03-02 ENCOUNTER — Encounter (HOSPITAL_BASED_OUTPATIENT_CLINIC_OR_DEPARTMENT_OTHER): Payer: Self-pay

## 2014-03-02 ENCOUNTER — Emergency Department (HOSPITAL_BASED_OUTPATIENT_CLINIC_OR_DEPARTMENT_OTHER): Payer: Medicare HMO

## 2014-03-02 DIAGNOSIS — Z951 Presence of aortocoronary bypass graft: Secondary | ICD-10-CM | POA: Diagnosis not present

## 2014-03-02 DIAGNOSIS — R21 Rash and other nonspecific skin eruption: Secondary | ICD-10-CM | POA: Insufficient documentation

## 2014-03-02 DIAGNOSIS — W19XXXA Unspecified fall, initial encounter: Secondary | ICD-10-CM

## 2014-03-02 DIAGNOSIS — Z79899 Other long term (current) drug therapy: Secondary | ICD-10-CM | POA: Insufficient documentation

## 2014-03-02 DIAGNOSIS — Z7982 Long term (current) use of aspirin: Secondary | ICD-10-CM | POA: Insufficient documentation

## 2014-03-02 DIAGNOSIS — S4992XA Unspecified injury of left shoulder and upper arm, initial encounter: Secondary | ICD-10-CM | POA: Diagnosis present

## 2014-03-02 DIAGNOSIS — W010XXA Fall on same level from slipping, tripping and stumbling without subsequent striking against object, initial encounter: Secondary | ICD-10-CM | POA: Insufficient documentation

## 2014-03-02 DIAGNOSIS — S42292A Other displaced fracture of upper end of left humerus, initial encounter for closed fracture: Secondary | ICD-10-CM | POA: Insufficient documentation

## 2014-03-02 DIAGNOSIS — Z7901 Long term (current) use of anticoagulants: Secondary | ICD-10-CM | POA: Insufficient documentation

## 2014-03-02 DIAGNOSIS — Y9289 Other specified places as the place of occurrence of the external cause: Secondary | ICD-10-CM | POA: Insufficient documentation

## 2014-03-02 DIAGNOSIS — E785 Hyperlipidemia, unspecified: Secondary | ICD-10-CM | POA: Insufficient documentation

## 2014-03-02 DIAGNOSIS — I2510 Atherosclerotic heart disease of native coronary artery without angina pectoris: Secondary | ICD-10-CM | POA: Diagnosis not present

## 2014-03-02 DIAGNOSIS — I10 Essential (primary) hypertension: Secondary | ICD-10-CM | POA: Diagnosis not present

## 2014-03-02 DIAGNOSIS — Z862 Personal history of diseases of the blood and blood-forming organs and certain disorders involving the immune mechanism: Secondary | ICD-10-CM | POA: Insufficient documentation

## 2014-03-02 DIAGNOSIS — Y998 Other external cause status: Secondary | ICD-10-CM | POA: Diagnosis not present

## 2014-03-02 DIAGNOSIS — Z9889 Other specified postprocedural states: Secondary | ICD-10-CM | POA: Insufficient documentation

## 2014-03-02 DIAGNOSIS — Y9389 Activity, other specified: Secondary | ICD-10-CM | POA: Insufficient documentation

## 2014-03-02 DIAGNOSIS — S42302A Unspecified fracture of shaft of humerus, left arm, initial encounter for closed fracture: Secondary | ICD-10-CM

## 2014-03-02 DIAGNOSIS — T148XXA Other injury of unspecified body region, initial encounter: Secondary | ICD-10-CM

## 2014-03-02 MED ORDER — OXYCODONE-ACETAMINOPHEN 5-325 MG PO TABS
1.0000 | ORAL_TABLET | Freq: Once | ORAL | Status: AC
  Administered 2014-03-02: 1 via ORAL
  Filled 2014-03-02: qty 1

## 2014-03-02 MED ORDER — OXYCODONE-ACETAMINOPHEN 5-325 MG PO TABS: 1.0000 | ORAL_TABLET | ORAL | Status: AC | PRN

## 2014-03-02 NOTE — ED Notes (Signed)
Patient transported to X-ray 

## 2014-03-02 NOTE — Discharge Instructions (Signed)
Humerus Fracture, Treated with Immobilization  The humerus is the large bone in your upper arm. You have a broken (fractured) humerus. These fractures are easily diagnosed with X-rays.  TREATMENT   Simple fractures which will heal without disability are treated with simple immobilization. Immobilization means you will wear a cast, splint, or sling. You have a fracture which will do well with immobilization. The fracture will heal well simply by being held in a good position until it is stable enough to begin range of motion exercises. Do not take part in activities which would further injure your arm.   HOME CARE INSTRUCTIONS    Put ice on the injured area.   Put ice in a plastic bag.   Place a towel between your skin and the bag.   Leave the ice on for 15-20 minutes, 03-04 times a day.   If you have a cast:   Do not scratch the skin under the cast using sharp or pointed objects.   Check the skin around the cast every day. You may put lotion on any red or sore areas.   Keep your cast dry and clean.   If you have a splint:   Wear the splint as directed.   Keep your splint dry and clean.   You may loosen the elastic around the splint if your fingers become numb, tingle, or turn cold or blue.   If you have a sling:   Wear the sling as directed.   Do not put pressure on any part of your cast or splint until it is fully hardened.   Your cast or splint can be protected during bathing with a plastic bag. Do not lower the cast or splint into water.   Only take over-the-counter or prescription medicines for pain, discomfort, or fever as directed by your caregiver.   Do range of motion exercises as instructed by your caregiver.   Follow up as directed by your caregiver. This is very important in order to avoid permanent injury or disability and chronic pain.  SEEK IMMEDIATE MEDICAL CARE IF:    Your skin or nails in the injured arm turn blue or gray.   Your arm feels cold or numb.   You develop severe  pain in the injured arm.   You are having problems with the medicines you were given.  MAKE SURE YOU:    Understand these instructions.   Will watch your condition.   Will get help right away if you are not doing well or get worse.  Document Released: 07/04/2000 Document Revised: 06/20/2011 Document Reviewed: 05/12/2010  ExitCare Patient Information 2015 [employer]. This information is not intended to replace advice given to you by your health care provider. Make sure you discuss any questions you have with your health care provider.

## 2014-03-02 NOTE — ED Notes (Signed)
[place] Fell onto left upper arm on concrete floor, possible deformity noted to humerus.

## 2014-03-02 NOTE — ED Provider Notes (Signed)
CSN: [id number]     Arrival date & time 03/02/14  1029 History   First MD Initiated Contact with Patient 03/02/14 1052     Chief Complaint  Patient presents with  . Fall     (Consider location/radiation/quality/duration/timing/severity/associated sxs/prior Treatment) HPI  This is a 80 year old male who presents following a fall. Patient reports that he tripped and fell onto his left arm yesterday. He denies hitting his head or loss of consciousness. He is on Xarelto. Denies any syncope. Patient is reporting left arm pain. Denies any numbness or tingling in that arm. Patient has been ambulatory. Current pain "20 out of 10." Worse with movement. Patient has not taken anything at home for the pain. Patient has also noted a "rash" over the back of his left arm.  Past Medical History  Diagnosis Date  . Hypertension   . Anemia   . Hyperlipidemia   . Coronary artery disease     Status post CABG 1985   Past Surgical History  Procedure Laterality Date  . Coronary artery bypass graft    . [place] echocardiography  06-20-2003    EF 60-65%  . Cardiac catheterization    . Cardioversion N/A 01/08/2013    Procedure: CARDIOVERSION;  Surgeon: Vesta MixerPhilip J Nahser, MD;  [facility];  Service: Cardiovascular;  Laterality: N/A;   No family history on file. History  Substance Use Topics  . Smoking status: Never Smoker   . Smokeless tobacco: Not on file  . Alcohol Use: No    Review of Systems  Constitutional: Negative.   Respiratory: Negative for chest tightness and shortness of breath.   Cardiovascular: Negative.  Negative for chest pain.  Gastrointestinal: Negative.  Negative for abdominal pain.  Musculoskeletal: Negative for back pain, gait problem and neck pain.       Left shoulder pain  Skin: Positive for color change and rash. Negative for wound.  Neurological: Negative for dizziness, syncope, light-headedness and headaches.  All other systems reviewed and are  negative.     Allergies  Review of patient's allergies indicates no known allergies.  Home Medications   Prior to Admission medications   Medication Sig Start Date End Date Taking? Authorizing Provider  aspirin EC 81 MG tablet Take 81 mg by mouth daily.    Historical Provider, MD  atenolol (TENORMIN) 50 MG tablet Take 1 tablet (50 mg total) by mouth daily. 02/28/12 10/02/13  Vesta MixerPhilip J Nahser, MD  B Complex-C (SUPER B COMPLEX PO) Take 1 tablet by mouth daily.    Historical Provider, MD  Cholecalciferol (VITAMIN D-3 PO) Take 1,000 mg by mouth daily.      Historical Provider, MD  Multiple Vitamins-Minerals (MULTIVITAMIN PO) Take by mouth.      Historical Provider, MD  nitroGLYCERIN (NITRODUR - DOSED IN MG/24 HR) 0.2 mg/hr patch Use 1/2 patch to left shoulder daily for 24 hours 09/30/13   Enid BaasKarl Fields, MD  nitroGLYCERIN (NITROSTAT) 0.4 MG SL tablet Place 1 tablet (0.4 mg total) under the tongue every 5 (five) minutes as needed for chest pain. 02/20/14   Vesta MixerPhilip J Nahser, MD  Omega-3 Fatty Acids (FISH OIL) 1200 MG CAPS Take 1 capsule by mouth daily.     Historical Provider, MD  oxyCODONE-acetaminophen (PERCOCET/ROXICET) 5-325 MG per tablet Take 1 tablet by mouth every 4 (four) hours as needed for severe pain. 03/02/14   Shon Batonourtney F Roxan Yamamoto, MD  Rivaroxaban (XARELTO) 15 MG TABS tablet Take 1 tablet (15 mg total) by mouth daily with supper. 02/20/14  Vesta MixerPhilip J Nahser, MD  rosuvastatin (CRESTOR) 10 MG tablet Take 10 mg by mouth daily.      Historical Provider, MD  silodosin (RAPAFLO) 8 MG CAPS capsule Take 8 mg by mouth daily with breakfast.      Historical Provider, MD   BP 166/76 mmHg  Pulse 60  Resp 18  Wt 145 lb (65.772 kg)  SpO2 98% Physical Exam  Constitutional: He is oriented to person, place, and time. He appears well-developed and well-nourished. No distress.  HENT:  Head: Normocephalic and atraumatic.  Mouth/Throat: Oropharynx is clear and moist.  Eyes: Pupils are equal, round, and  reactive to light.  Neck: Neck supple.  Cardiovascular: Normal rate, regular rhythm and normal heart sounds.   No murmur heard. Pulmonary/Chest: Effort normal and breath sounds normal. No respiratory distress. He has no wheezes.  Abdominal: Soft. There is no tenderness.  Musculoskeletal: He exhibits no edema.  Tenderness to palpation of the proximal left humerus, limited range of motion, associated ecchymosis noted posteriorly and medially, mild deformity, 2+ radial pulse, sensation and motor intact distally  Neurological: He is alert and oriented to person, place, and time.  Skin: Skin is warm and dry.  Psychiatric: He has a normal mood and affect.  Nursing note and vitals reviewed.   ED Course  Procedures (including critical care time) Labs Review Labs Reviewed - No data to display  Imaging Review Dg Humerus Left  03/02/2014   CLINICAL DATA:  Fall yesterday with left humeral pain. Initial encounter.  EXAM: LEFT HUMERUS - 2+ VIEW  COMPARISON:  None.  FINDINGS: There is a fracture of the surgical neck of the proximal humerus demonstrating mild impaction and displacement. There also is an avulsive fracture of the greater tuberosity showing mild displacement. Overlying soft tissue swelling present. No underlying bony lesion identified. No dislocation.  IMPRESSION: Fracture through the surgical neck of the proximal left humerus demonstrating mild displacement and impaction. Additional avulsion of the greater tuberosity shows mild displacement.   Electronically Signed   By: Irish LackGlenn  Yamagata M.D.   On: 03/02/2014 11:37     EKG Interpretation None      MDM   Final diagnoses:  Humerus fracture, left, closed, initial encounter  Contusion    Patient presents following a reported mechanical fall. Reporting only left arm pain. Exam notable for contusion and ecchymosis over the left arm with deformity. Patient is neurovascularly intact. Patient has been ambulatory. Patient given Percocet for  pain. Plain films show proximal humerus fracture with mild displacement and impaction. Patient was placed in a sling. Patient reports that he has an orthopedist, Dr. Ophelia CharterYates. We'll have him follow-up closely as an outpatient. In the meantime, patient instructed to use ice and will be given pain medication for pain. Patient stated understanding.  After history, exam, and medical workup I feel the patient has been appropriately medically screened and is safe for discharge home. Pertinent diagnoses were discussed with the patient. Patient was given return precautions.     Shon Batonourtney F Maribella Kuna, MD 03/02/14 [phone]

## 2014-07-21 HISTORY — PX: CARDIOVERSION: SHX1299

## 2014-08-26 ENCOUNTER — Other Ambulatory Visit: Payer: Self-pay

## 2014-08-26 ENCOUNTER — Telehealth: Payer: Self-pay | Admitting: Cardiovascular Disease

## 2014-08-26 NOTE — Telephone Encounter (Signed)
Follow Up  Pt called states that he believes that he is in afib. He says the his heart is "pounding" with no other symptoms. No furhter details would like to speak with a nurse to discuss.

## 2014-08-26 NOTE — Telephone Encounter (Signed)
Patient called back wanting to see Dr. Elease HashimotoNahser about his a.fib. Patient denies any chest pain or SOB. Patient states his HR is a little up and BP is fine. Patient does not want to see anyone except Dr. Elease HashimotoNahser. Patient stated "If I can see Dr. Elease HashimotoNahser, I will, if I can't then I, can't." Offered patient to see a PA or NP, patient refused. Will send message to Dr. Elease HashimotoNahser and his nurse.

## 2014-08-26 NOTE — Telephone Encounter (Signed)
See if he can come to the  office to see me on Thursday .

## 2014-08-26 NOTE — Telephone Encounter (Signed)
Left message for patient to call back  

## 2014-08-27 NOTE — Telephone Encounter (Signed)
Patient has appointment with Dr. Elease HashimotoNahser in [place] on Thursday 5/19

## 2014-08-28 ENCOUNTER — Other Ambulatory Visit: Payer: Self-pay | Admitting: *Deleted

## 2014-08-28 ENCOUNTER — Encounter: Payer: Self-pay | Admitting: Cardiovascular Disease

## 2014-08-28 ENCOUNTER — Ambulatory Visit (INDEPENDENT_AMBULATORY_CARE_PROVIDER_SITE_OTHER): Payer: Medicare HMO | Admitting: Cardiovascular Disease

## 2014-08-28 VITALS — BP 150/72 | HR 66 | Ht 66.0 in | Wt 146.5 lb

## 2014-08-28 DIAGNOSIS — I2510 Atherosclerotic heart disease of native coronary artery without angina pectoris: Secondary | ICD-10-CM

## 2014-08-28 DIAGNOSIS — I480 Paroxysmal atrial fibrillation: Secondary | ICD-10-CM

## 2014-08-28 NOTE — Patient Instructions (Signed)
Medication Instructions:  Please continue current medications  Labwork: None  Testing/Procedures: None  Follow-Up: Call or return to clinic prn if these symptoms worsen or fail to improve as anticipated.   

## 2014-08-28 NOTE — Progress Notes (Signed)
Cardiology Office Note   Date:  08/28/2014   ID:  Larry Schroeder, DOB May 07, 1933, MRN [id number]  PCP:  Londell MohPHARR,WALTER DAVIDSON, MD  Cardiologist:   Vesta MixerNahser, Philip J, MD   Chief Complaint  Patient presents with  . other    6 month follow up. Meds reviewed by the patient's med list. Pt[place] was in [place] a second home and had a cardioversion.    1. CAD- s/p CABG 1985 - his last cath was in 1996. The saphenous vein graft to the obtuse marginal artery with a 1, first diagonal, and LAD as was widely patent. The saphenous vein graft to the right coronary artery was occluded. His native coronary arteries are occluded. A stress Myoview study in 2008 revealed inferior basilar scar. Stress Myoview study in November, 2012 revealed a similar pattern of inferior basilar ischemia with normal perfusion and the other walls. 2. chronic renal insufficiency 3. Dyslipidemia-low HDL 4. Anemia 5. Atrial fibrillation  History of Present Illness:  CP is doing well. His medical Dr. has changed some of his blood pressure medications. He was taking Avapro and Avalide. It caused his creatinine to go up slightly. He also noted that he was having an itchy rash on his arms. His medical Dr. changed him to Losartan and the rash went away and his creatinine has improved slightly.  He continues to exercise on a regular basis. He walks 3-1/2 miles a day and one hour. He exercises 5 times a week. He's not had any cardiac problems.  Some recent lab work indicated that his alkaline phosphatase was minimally elevated.  February 28, 2012: He recently had a UTI.   Aug 27, 2012:  CP is doing well. Walks on the treadmill 5 days a week. 3.5 mph for 1 hour a day. No angina. Needs fasting labs.   December 05, 2012:  CP is a 81 yo with a remote hx of CAD, CABG. He was admitted to the hospital [place] in [place] with bilateral pneumonia. He had a temperature of 100 and was coughing up yellow sputum. He  was also found to have a mildly elevated B. natruretic peptide was thought to have some mild congestive heart failure. He was treated and since that time feels a little bit better. He was also noted to be in atrial fibrillation. They treated him with Lovenox injections during hospitalization but did not change any of his medications on discharge. He was discharged on aspirin, Plavix.  He presents today feeling better but he is in atrial fibrillation. I reviewed all of our previous records and the atrial fibrillation is new. He still has shortness breath with exertion such as climbing stairs. He has not had any fevers since being discharged from the hospital. He still has a cough and his sputum is clear.  Sept. 25, 2014:  CP is seen today for follow up of his A-Fib. He had some concern about the side effects. He has had a cough.   Dec. 2, 2014; CP is doing very well. He has maintained sinus rhythm. He is walks for an hour every day, 5 days a week. He denies any chest pain or shortness of breath. He is able to do all his normal activities without any significant problems.  October 02, 2013:  He is doing well. Walks for 1 hour a day on the treadmill. He has some left arm pain. He has seen Dr. Darrick PennaFields.   He has some fatigue.   BP is a  bit high today. His readings have been normAL.  Nov. 12, 2015:  CP is doing well. Has maintained NSR . He has atrial fib in the past and was cardioverted. Has not had any further episodes of AFib since then.  Walks 3.3 miles a day - 5 days a week at the [facility] in [place].  He takes his BP on a regular basis. Always in the good range  He and his wife are planning on moving to [place]. He's not sure to be coming back [place]. He will be trying to find a doctor down in [place].   May 189, 2016:  Larry Schroeder is a 81 y.o. male who presents for follow up of his CAD and atrial fib .  he has moved to [place] .  He had another episode  of of atrial fib and had another cardioversion. He is almost completely asymptomatic with his atrial fib.  Gets a little .  He was tried on Multaq, developed severe edema and required hospitalization.   He checks his BP and readings have been ok He wants to continue walking on the treadmill and riding the bike .     Past Medical History  Diagnosis Date  . Hypertension   . Anemia   . Hyperlipidemia   . Coronary artery disease     Status post CABG 1985    Past Surgical History  Procedure Laterality Date  . Coronary artery bypass graft    . US echocardiography  06-20-2003    EF 60-65%  . Cardiac catheterization    . Cardioversion N/A 01/08/2013    Procedure: CARDIOVERSION;  Surgeon: Vesta Mixer, MD;  Location: [facility] ENDOSCOPY;  Service: Cardiovascular;  Laterality: N/A;  . Cardioversion  07/21/2014    [place]     Current Outpatient Prescriptions  Medication Sig Dispense Refill  . aspirin EC 81 MG tablet Take 81 mg by mouth daily.    . B Complex-C (SUPER B COMPLEX PO) Take 1 tablet by mouth daily.    . Cholecalciferol (VITAMIN D-3 PO) Take 1,000 mg by mouth daily.      . Magnesium 250 MG TABS Take 250 mg by mouth daily.    . metoprolol succinate (TOPROL-XL) 25 MG 24 hr tablet Take 25 mg by mouth daily.    . Multiple Vitamins-Minerals (MULTIVITAMIN PO) Take by mouth.      . nitroGLYCERIN (NITRODUR - DOSED IN MG/24 HR) 0.2 mg/hr patch Use 1/2 patch to left shoulder daily for 24 hours 90 patch 2  . nitroGLYCERIN (NITROSTAT) 0.4 MG SL tablet Place 1 tablet (0.4 mg total) under the tongue every 5 (five) minutes as needed for chest pain. 25 tablet 6  . Omega-3 Fatty Acids (FISH OIL) 1200 MG CAPS Take 1 capsule by mouth daily.     Marland Kitchen oxyCODONE-acetaminophen (PERCOCET/ROXICET) 5-325 MG per tablet Take 1 tablet by mouth every 4 (four) hours as needed for severe pain. 15 tablet 0  . Rivaroxaban (XARELTO) 15 MG TABS tablet Take 1 tablet (15 mg total) by mouth daily with supper. 90 tablet 3   . rosuvastatin (CRESTOR) 10 MG tablet Take 10 mg by mouth daily.      . silodosin (RAPAFLO) 8 MG CAPS capsule Take 8 mg by mouth daily with breakfast.      . Turmeric 450 MG CAPS Take 450 mg by mouth daily.     No current facility-administered medications for this visit.    Allergies:   Review of patient's allergies indicates no  known allergies.    Social History:  The patient  reports that he has never smoked. He does not have any smokeless tobacco history on file. He reports that he does not drink alcohol or use illicit drugs.   Family History:  The patient's Family history is unknown by patient.    ROS:  Please see the history of present illness.    Review of Systems: Constitutional:  denies fever, chills, diaphoresis, appetite change and fatigue.  HEENT: denies photophobia, eye pain, redness, hearing loss, ear pain, congestion, sore throat, rhinorrhea, sneezing, neck pain, neck stiffness and tinnitus.  Respiratory: denies SOB, DOE, cough, chest tightness, and wheezing.  Cardiovascular: denies chest pain, palpitations and leg swelling.  Gastrointestinal: denies nausea, vomiting, abdominal pain, diarrhea, constipation, blood in stool.  Genitourinary: denies dysuria, urgency, frequency, hematuria, flank pain and difficulty urinating.  Musculoskeletal: denies  myalgias, back pain, joint swelling, arthralgias and gait problem.   Skin: denies pallor, rash and wound.  Neurological: denies dizziness, seizures, syncope, weakness, light-headedness, numbness and headaches.   Hematological: denies adenopathy, easy bruising, personal or family bleeding history.  Psychiatric/ Behavioral: denies suicidal ideation, mood changes, confusion, nervousness, sleep disturbance and agitation.       All other systems are reviewed and negative.    PHYSICAL EXAM: VS:  BP 150/72 mmHg  Pulse 66  Ht  (1.676 m)  Wt 66.452 kg (146 lb 8 oz)  BMI 23.66 kg/m2 , BMI Body mass index is 23.66  kg/(m^2). GEN: Well nourished, well developed, in no acute distress HEENT: normal Neck: no JVD, carotid bruits, or masses Cardiac: RRR; no murmurs, rubs, or gallops,no edema  Respiratory:  clear to auscultation bilaterally, normal work of breathing GI: soft, nontender, nondistended, + BS MS: no deformity or atrophy Skin: warm and dry, no rash Neuro:  Strength and sensation are intact Psych: normal   EKG:  EKG is ordered today. The ekg ordered today demonstrates normal sinus rhythm at 66. He has a first-degree AV block.   Recent Labs: 02/17/2014: ALT 26; BUN 19; Creatinine 1.2; Potassium 4.6; Sodium 138    Lipid Panel    Component Value Date/Time   CHOL 100 02/17/2014 0749   TRIG 79.0 02/17/2014 0749   HDL 30.20* 02/17/2014 0749   CHOLHDL 3 02/17/2014 0749   VLDL 15.8 02/17/2014 0749   LDLCALC 54 02/17/2014 0749      Wt Readings from Last 3 Encounters:  08/28/14 66.452 kg (146 lb 8 oz)  03/02/14 65.772 kg (145 lb)  02/20/14 65.953 kg (145 lb 6.4 oz)      Other studies Reviewed: Additional studies/ records that were reviewed today include: . Review of the above records demonstrates:    ASSESSMENT AND PLAN:  1. CAD- s/p CABG 1985 - his last cath was in 1996. The saphenous vein graft to the obtuse marginal artery with a 1, first diagonal, and LAD as was widely patent. The saphenous vein graft to the right coronary artery was occluded. His native coronary arteries are occluded. A stress Myoview study in 2008 revealed inferior basilar scar. Stress Myoview study in November, 2012 revealed a similar pattern of inferior basilar ischemia with normal perfusion and the other walls. 2. chronic renal insufficiency 3. Dyslipidemia-low HDL 4. Anemia 5. Atrial fibrillation- his main question today was about atrial fibrillation. He has been cardioverted once since he moved to [place]. Currently I think he's doing very well. I've reassured him that he may do all of the exercises that  he would  like. He has been tried on Multaq but developed what sounds like anasarca. I'm not eager to start him on any additional antiarrhythmics. We could try amiodarone but at this point he's completely asymptomatic. He inquired about A. fib ablation. I do not think that he needs to consider that at this time. He'll be moving back down to [place] but occasionally comes back up to [place]. I told him that I would be happy to see him in the future if needed.   Current medicines are reviewed at length with the patient today.  The patient does not have concerns regarding medicines.  The following changes have been made:  no change  Labs/ tests ordered today include:  Orders Placed This Encounter  Procedures  . EKG 12-Lead     Disposition:   FU with me as needed.     Nahser, Deloris PingPhilip J, MD  08/28/2014 3:17 PM    [facility] [address], [place], [place]  [address] Phone: [phone]; Fax: [phone]   [facility]  [address] [place], [place]  [address] [phone]    Fax [phone]) [phone]

## 2014-08-29 ENCOUNTER — Telehealth: Payer: Self-pay | Admitting: *Deleted

## 2014-08-29 ENCOUNTER — Other Ambulatory Visit: Payer: Self-pay

## 2014-08-29 MED ORDER — ROSUVASTATIN CALCIUM 10 MG PO TABS: 10.0000 mg | ORAL_TABLET | Freq: Every day | ORAL | Status: DC

## 2014-08-29 MED ORDER — ROSUVASTATIN CALCIUM 10 MG PO TABS: 10.0000 mg | ORAL_TABLET | Freq: Every day | ORAL | Status: AC

## 2014-08-29 MED ORDER — NITROGLYCERIN 0.4 MG SL SUBL: 0.4000 mg | SUBLINGUAL_TABLET | SUBLINGUAL | Status: AC | PRN

## 2014-08-29 NOTE — Telephone Encounter (Signed)
Pt would like us to mail him a copy of his EKG that was done yesterday..Marland Kitchen

## 2014-08-29 NOTE — Telephone Encounter (Signed)
[facility] now

## 2014-08-29 NOTE — Telephone Encounter (Signed)
Must be refilled by presciber.

## 2014-09-29 IMAGING — CT CT CHEST W/O CM
3 of 4 series · 16 of 30 positions shown, 18 images · non-contrast
Comparison: PA and lateral chest x-ray February 04, 2012.

CLINICAL DATA: Left lower lobe pulmonary nodule demonstrated on
previous chest x-ray February 04, 2012

EXAM:
CT CHEST WITHOUT CONTRAST
TECHNIQUE: Multidetector CT imaging of the chest was performed following the
standard protocol without IV contrast..

[Series 3: chest w/o · axial · non-contrast · 0.60mm/px · z∈[-238,-34]mm · 5 of 63 slices shown, 7 images]
[im 11/63  mediastinal]
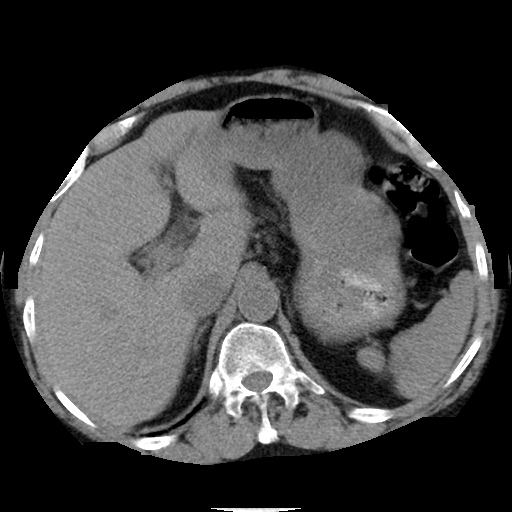
[im 11/63  lung]
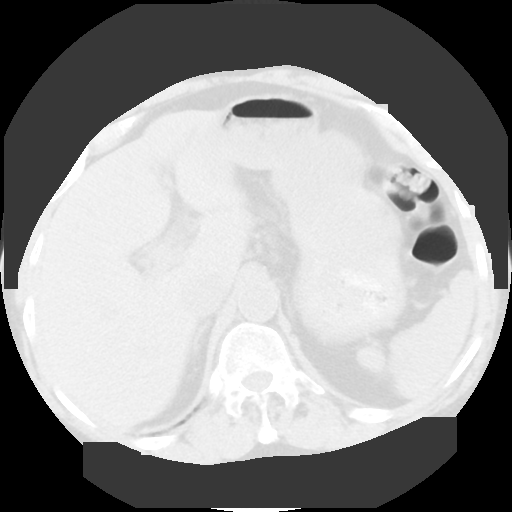
[im 21/63  lung]
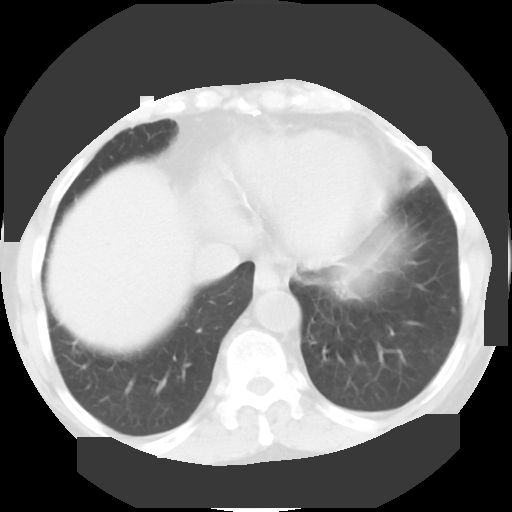
[im 32/63  lung]
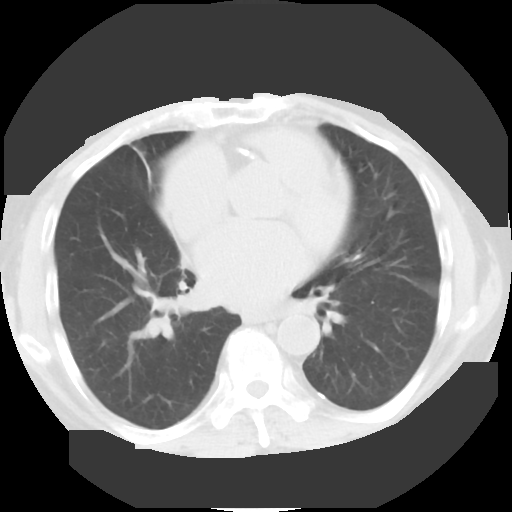
[im 42/63  lung]
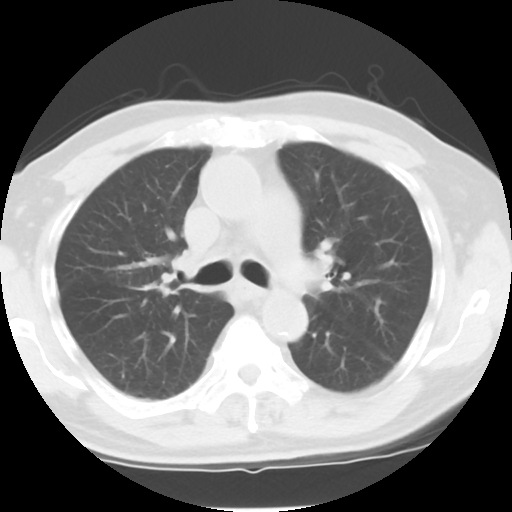
[im 52/63  mediastinal]
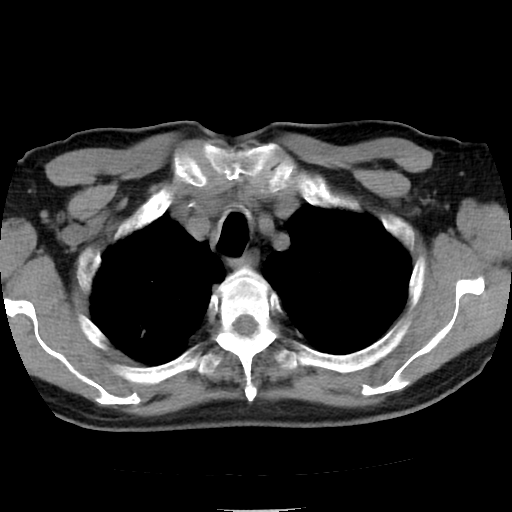
[im 52/63  lung]
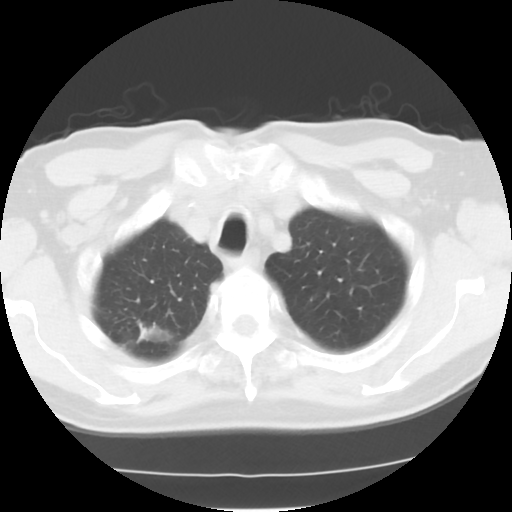

[Series 4: lung windows · axial · 0.60mm/px · z∈[-238,-34]mm · 5 of 63 slices shown]
[im 11/63  lung]
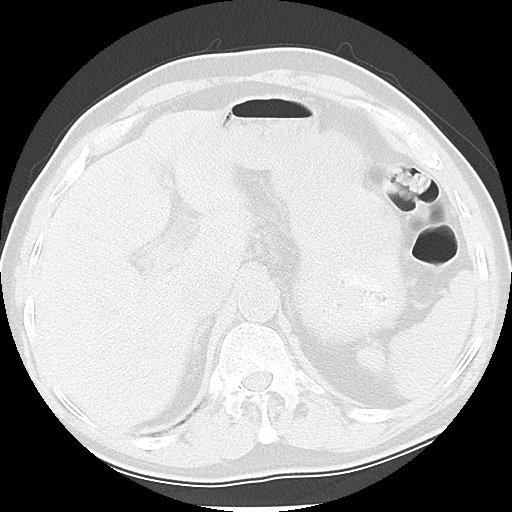
[im 21/63  lung]
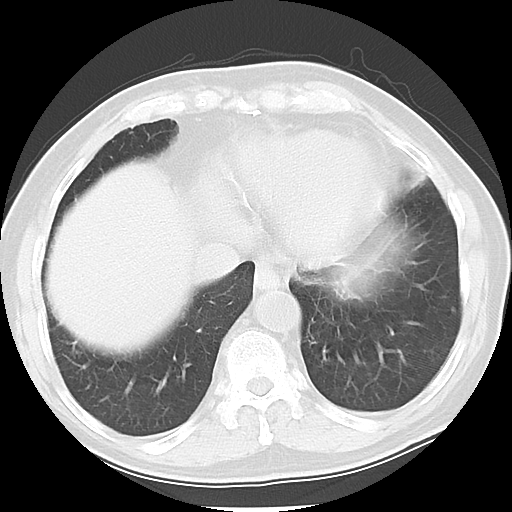
[im 32/63  lung]
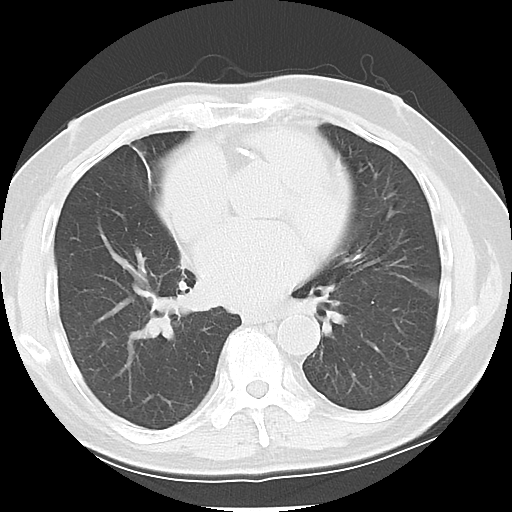
[im 42/63  lung]
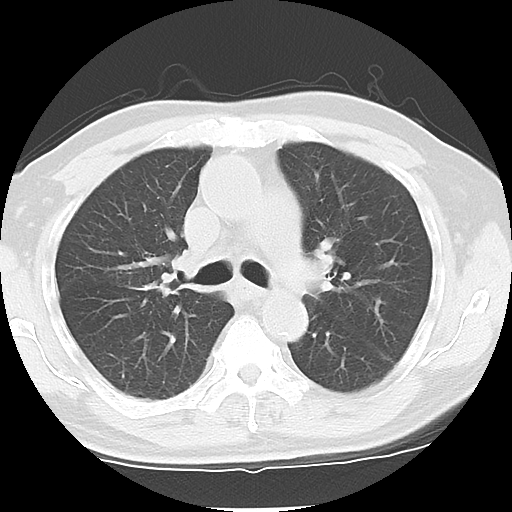
[im 52/63  lung]
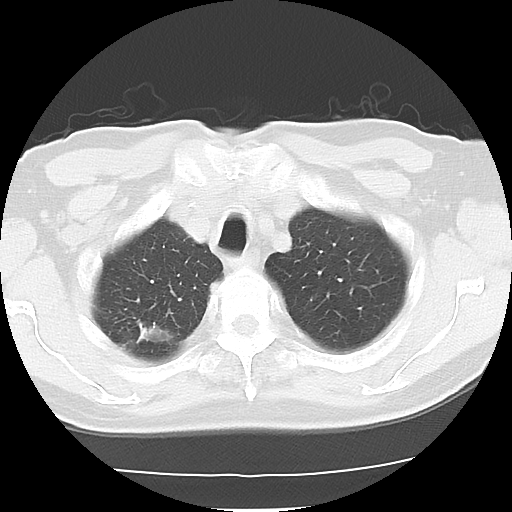

[Series 602: sagittal body · sagittal · 0.61mm/px · 6 of 125 slices shown]
[im 10/125  mediastinal]
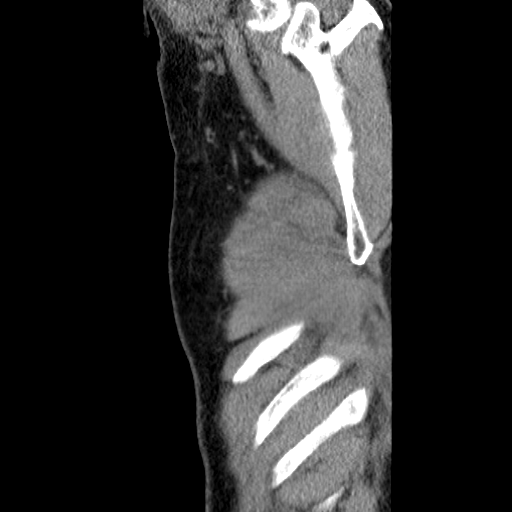
[im 29/125  mediastinal]
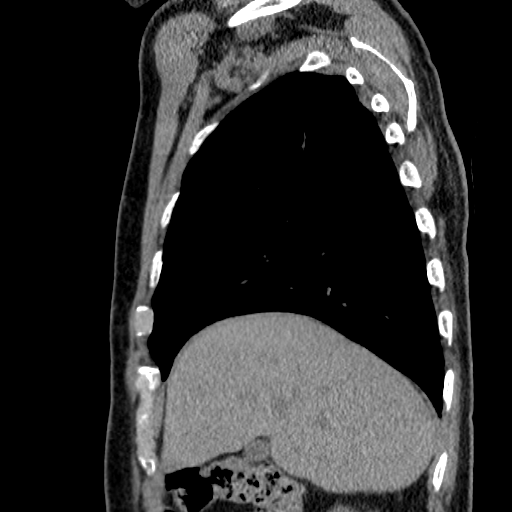
[im 39/125  mediastinal]
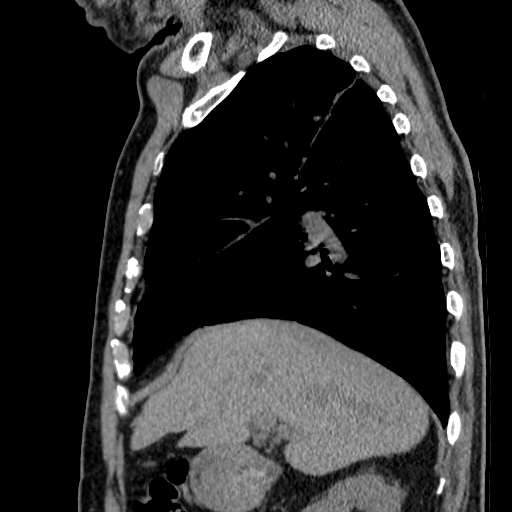
[im 58/125  mediastinal]
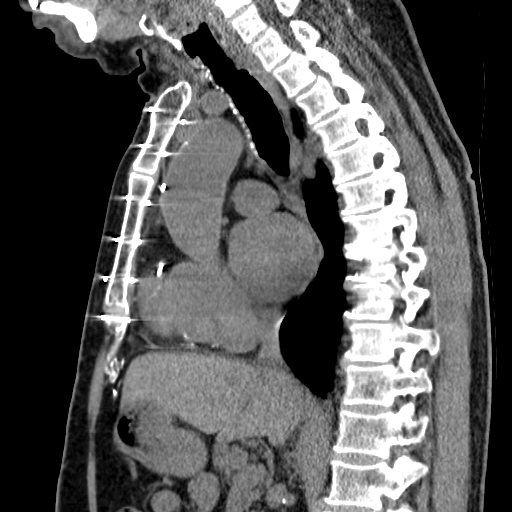
[im 67/125  mediastinal]
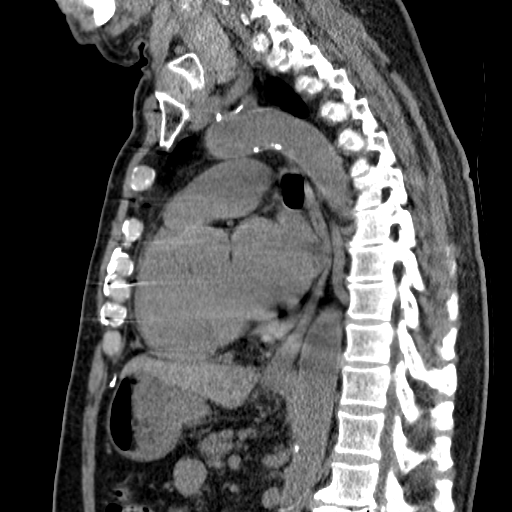
[im 86/125  mediastinal]
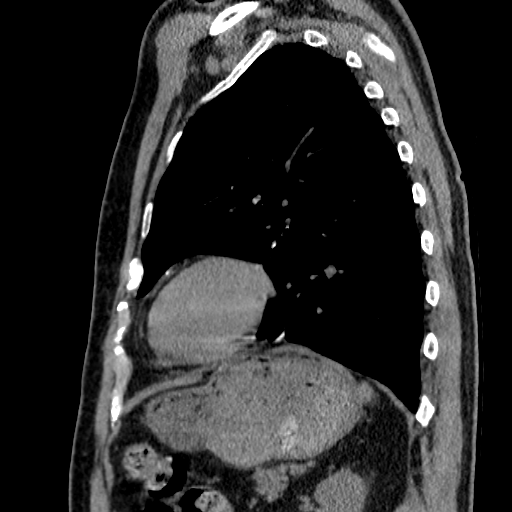

[16 of 30 positions shown; findings below may reference images not displayed]

FINDINGS: In the inferior lateral aspect of the left lower lobe on image 42 of
series 4 there is a 3 mm calcified subpleural nodule which
corresponds to the chest x-ray findings. There is a pleural-based
calcification on image 31 of series 3 posteromedially the left lower
hemithorax. On the right no calcified nodules are demonstrated.
There is patchy density posteriorlyin the right pulmonary apex which
likely reflects scarring without an associated mass.

The cardiac chambers top-normal in size. There are coronary artery
calcifications. The patient has undergone previous CABG. The caliber
of the thoracic aorta is normal. There is no mediastinal or hilar
lymphadenopathy. The thoracic esophagus is unremarkable. The thyroid
gland is normal. There is no pleural nor pericardial effusion.

Within the upper abdomen the observed portions of the liver and
spleen exhibit no acute abnormalities. There is a 3 mm diameter
calcification in the posterior aspect of the right hepatic lobe
consistent with a granuloma.

The thoracic vertebral bodies are preserved in height. There is disc
space narrowing at multiple levels. The observed ribs are normal.
IMPRESSION: 1. There is a stable 3 mm diameter subpleural nodule in the left
lower lobe consistent with previous granulomatous infection. There
is a calcified nodule in the right hepatic lobe as well.
2. There is patchy interstitial type density in the posterior aspect
of the right apex extending toward the hilum which likely reflects
scarring. There is no parenchymal mass or alveolar pneumonia.
3. There is no evidence of CHF.

## 2014-10-21 ENCOUNTER — Telehealth: Payer: Self-pay | Admitting: Cardiovascular Disease

## 2014-10-21 NOTE — Telephone Encounter (Signed)
Spoke with pt and he states that [facility] was at [facility] today and was working out on the treadmill at 2.2mph for 45 minutes. Pt states that he placed his hands on the HR censors and noted his HR was "jumping back and forth between 70-135". [facility] and pt states that it was 127/65, HR 66 but this was prior to working out. Pt denies CP, dizziness, lightheadedness and swelling. Pt states that he does have very slight SOB in the mornings but is fine throughout the day. Pt states that he did take his Metoprolol today. Pt would like to know if Dr. Elease HashimotoNahser feels like he needs to come in for an EKG. Informed pt that I would route this information to Dr. Elease HashimotoNahser and his nurse Marcelino DusterMichelle for review, advisement and follow up.

## 2014-10-21 NOTE — Telephone Encounter (Signed)
The HR sensors on those treadmills are frequently wrong and interpret muscle movement as HR . I would not trust those readings to be exact

## 2014-10-21 NOTE — Telephone Encounter (Signed)
New Message     Patient c/o Palpitations:  High priority if patient c/o lightheadedness and shortness of breath.  1. How long have you been having palpitations? 10/21/14   Pt was working out on treadmill at [facility]  2. Are you currently experiencing lightheadedness and shortness of breath? no  3. Have you checked your BP and heart rate? (document readings)  127/65  4. Are you experiencing any other symptoms? no

## 2014-10-21 NOTE — Telephone Encounter (Signed)
ERROR

## 2014-10-22 NOTE — Telephone Encounter (Signed)
Spoke with patient and reviewed Dr. Harvie BridgeNahser's advice.  I asked him how he is feeling and he advised that he feels fine, he was just concerned about the treadmill heart monitor.  I advised that if he experiences any symptoms of irregular heart rate or other concerns to call back.  Patient verbalized relief and agreement with plan of care.  He thanked me for the call.

## 2014-12-10 ENCOUNTER — Encounter (HOSPITAL_BASED_OUTPATIENT_CLINIC_OR_DEPARTMENT_OTHER): Payer: Medicare PPO

## 2015-02-10 ENCOUNTER — Telehealth: Payer: Self-pay | Admitting: Cardiovascular Disease

## 2015-02-10 NOTE — Telephone Encounter (Signed)
I attempted to call the number given below and received message that the office was closed.  I spoke with patient and advised him that per Dr. Elease HashimotoNahser there is no need to hold Xarelto for a dental cleaning, even a vigorous cleaning.  Patient verbalized understanding and asked that I communicate that to someone in the dental office.  He then handed the phone to [place] and I reviewed Dr. Harvie BridgeNahser's advice; she verbalized understanding.  She asked that Dr. Elease HashimotoNahser sign form that she faxed and I advised that when it is received, he will sign and I will fax back.  She thanked me for the call.

## 2015-02-10 NOTE — Telephone Encounter (Signed)
Request for surgical clearance:  1. What type of surgery is being performed? CLEANING  2. When is this surgery scheduled? 02/10/15   3. Are there any medications that need to be held prior to surgery and how long?  BLOOD THINER-  NAME UNKNOWN  4. Name of physician performing surgery? Dr.Patel  5. What is your office phone and fax number?  [phone]  Fax   [phone]

## 2022-04-11 DEATH — deceased
